# Patient Record
Sex: Male | Born: 1945
Health system: Southern US, Community
[De-identification: ages and names within clinical notes are randomized; demographics above are authoritative.]

## PROBLEM LIST (undated history)

## (undated) DIAGNOSIS — I10 Essential (primary) hypertension: Secondary | ICD-10-CM

## (undated) DIAGNOSIS — M1990 Unspecified osteoarthritis, unspecified site: Secondary | ICD-10-CM

## (undated) HISTORY — PX: OTHER SURGICAL HISTORY: SHX169

## (undated) HISTORY — PX: JOINT REPLACEMENT: SHX530

---

## 2008-10-31 ENCOUNTER — Emergency Department (HOSPITAL_BASED_OUTPATIENT_CLINIC_OR_DEPARTMENT_OTHER): Admission: EM | Admit: 2008-10-31 | Discharge: 2008-10-31 | Payer: Self-pay | Admitting: Emergency Medicine

## 2008-11-02 ENCOUNTER — Emergency Department (HOSPITAL_BASED_OUTPATIENT_CLINIC_OR_DEPARTMENT_OTHER): Admission: EM | Admit: 2008-11-02 | Discharge: 2008-11-02 | Payer: Self-pay | Admitting: Emergency Medicine

## 2010-01-28 ENCOUNTER — Inpatient Hospital Stay (HOSPITAL_COMMUNITY): Admission: RE | Admit: 2010-01-28 | Discharge: 2010-02-02 | Payer: Self-pay | Admitting: Specialist

## 2010-02-10 ENCOUNTER — Encounter (INDEPENDENT_AMBULATORY_CARE_PROVIDER_SITE_OTHER): Payer: Self-pay | Admitting: Specialist

## 2010-02-10 ENCOUNTER — Ambulatory Visit (HOSPITAL_COMMUNITY): Admission: RE | Admit: 2010-02-10 | Discharge: 2010-02-10 | Payer: Self-pay | Admitting: Specialist

## 2010-02-10 ENCOUNTER — Ambulatory Visit: Payer: Self-pay | Admitting: Surgery

## 2011-03-04 LAB — CBC
HCT: 25.1 % — ABNORMAL LOW (ref 39.0–52.0)
HCT: 25.8 % — ABNORMAL LOW (ref 39.0–52.0)
Hemoglobin: 9.7 g/dL — ABNORMAL LOW (ref 13.0–17.0)
MCHC: 35.8 g/dL (ref 30.0–36.0)
MCV: 92.2 fL (ref 78.0–100.0)
MCV: 93.6 fL (ref 78.0–100.0)
Platelets: 187 10*3/uL (ref 150–400)
Platelets: 206 10*3/uL (ref 150–400)
RBC: 2.97 MIL/uL — ABNORMAL LOW (ref 4.22–5.81)
RDW: 12.9 % (ref 11.5–15.5)
RDW: 13.1 % (ref 11.5–15.5)
RDW: 13.2 % (ref 11.5–15.5)
WBC: 6.1 10*3/uL (ref 4.0–10.5)
WBC: 7.2 10*3/uL (ref 4.0–10.5)
WBC: 7.6 10*3/uL (ref 4.0–10.5)

## 2011-03-04 LAB — COMPREHENSIVE METABOLIC PANEL
ALT: 34 U/L (ref 0–53)
Alkaline Phosphatase: 93 U/L (ref 39–117)
CO2: 28 mEq/L (ref 19–32)
Chloride: 107 mEq/L (ref 96–112)
GFR calc Af Amer: >60 mL/min (ref >60)
GFR calc non Af Amer: >60 mL/min (ref >60)
Glucose, Bld: 70 mg/dL (ref 70–99)
Potassium: 4.1 mEq/L (ref 3.5–5.1)
Total Bilirubin: 0.6 mg/dL (ref 0.3–1.2)

## 2011-03-04 LAB — URINALYSIS, MICROSCOPIC ONLY
Glucose, UA: NEGATIVE mg/dL
Nitrite: NEGATIVE
Protein, ur: NEGATIVE mg/dL
Specific Gravity, Urine: 1.012 (ref 1.005–1.030)
Urobilinogen, UA: 0.2 mg/dL (ref 0.0–1.0)

## 2011-03-04 LAB — BASIC METABOLIC PANEL
Calcium: 7.9 mg/dL — ABNORMAL LOW (ref 8.4–10.5)
Chloride: 101 mEq/L (ref 96–112)
Creatinine, Ser: 1 mg/dL (ref 0.4–1.5)
Creatinine, Ser: 1.04 mg/dL (ref 0.4–1.5)
GFR calc non Af Amer: >60 mL/min (ref >60)
Glucose, Bld: 159 mg/dL — ABNORMAL HIGH (ref 70–99)
Potassium: 4.9 mEq/L (ref 3.5–5.1)
Sodium: 133 mEq/L — ABNORMAL LOW (ref 135–145)
Sodium: 136 mEq/L (ref 135–145)

## 2011-03-04 LAB — PROTIME-INR
INR: 0.94 (ref 0.00–1.49)
INR: 2.62 — ABNORMAL HIGH (ref 0.00–1.49)
INR: 3.62 — ABNORMAL HIGH (ref 0.00–1.49)
Prothrombin Time: 12.5 seconds (ref 11.6–15.2)
Prothrombin Time: 14.7 seconds (ref 11.6–15.2)
Prothrombin Time: 23.5 seconds — ABNORMAL HIGH (ref 11.6–15.2)

## 2011-03-04 LAB — TYPE AND SCREEN: Antibody Screen: NEGATIVE

## 2011-03-04 LAB — GLUCOSE, CAPILLARY

## 2011-03-04 LAB — DIFFERENTIAL
Basophils Absolute: 0 10*3/uL (ref 0.0–0.1)
Basophils Relative: 1 % (ref 0–1)
Eosinophils Relative: 4 % (ref 0–5)
Neutrophils Relative %: 56 % (ref 43–77)

## 2011-03-04 LAB — APTT: aPTT: <20 seconds — ABNORMAL LOW (ref 24–37)

## 2011-03-04 LAB — URINALYSIS, ROUTINE W REFLEX MICROSCOPIC
Glucose, UA: NEGATIVE mg/dL
Hgb urine dipstick: NEGATIVE
Ketones, ur: NEGATIVE mg/dL
Nitrite: NEGATIVE
Protein, ur: NEGATIVE mg/dL
Urobilinogen, UA: 0.2 mg/dL (ref 0.0–1.0)

## 2011-03-04 LAB — URINE CULTURE
Colony Count: NO GROWTH
Culture: NO GROWTH
Special Requests: NEGATIVE

## 2011-05-03 IMAGING — CR DG HIP COMPLETE 2+V*R*
3 series · 3 of 3 positions shown · non-contrast
Comparison: None

CLINICAL DATA: Pain.  Preoperative evaluation.

RIGHT HIP - COMPLETE 2+ VIEW

[t pelvis a.p.]
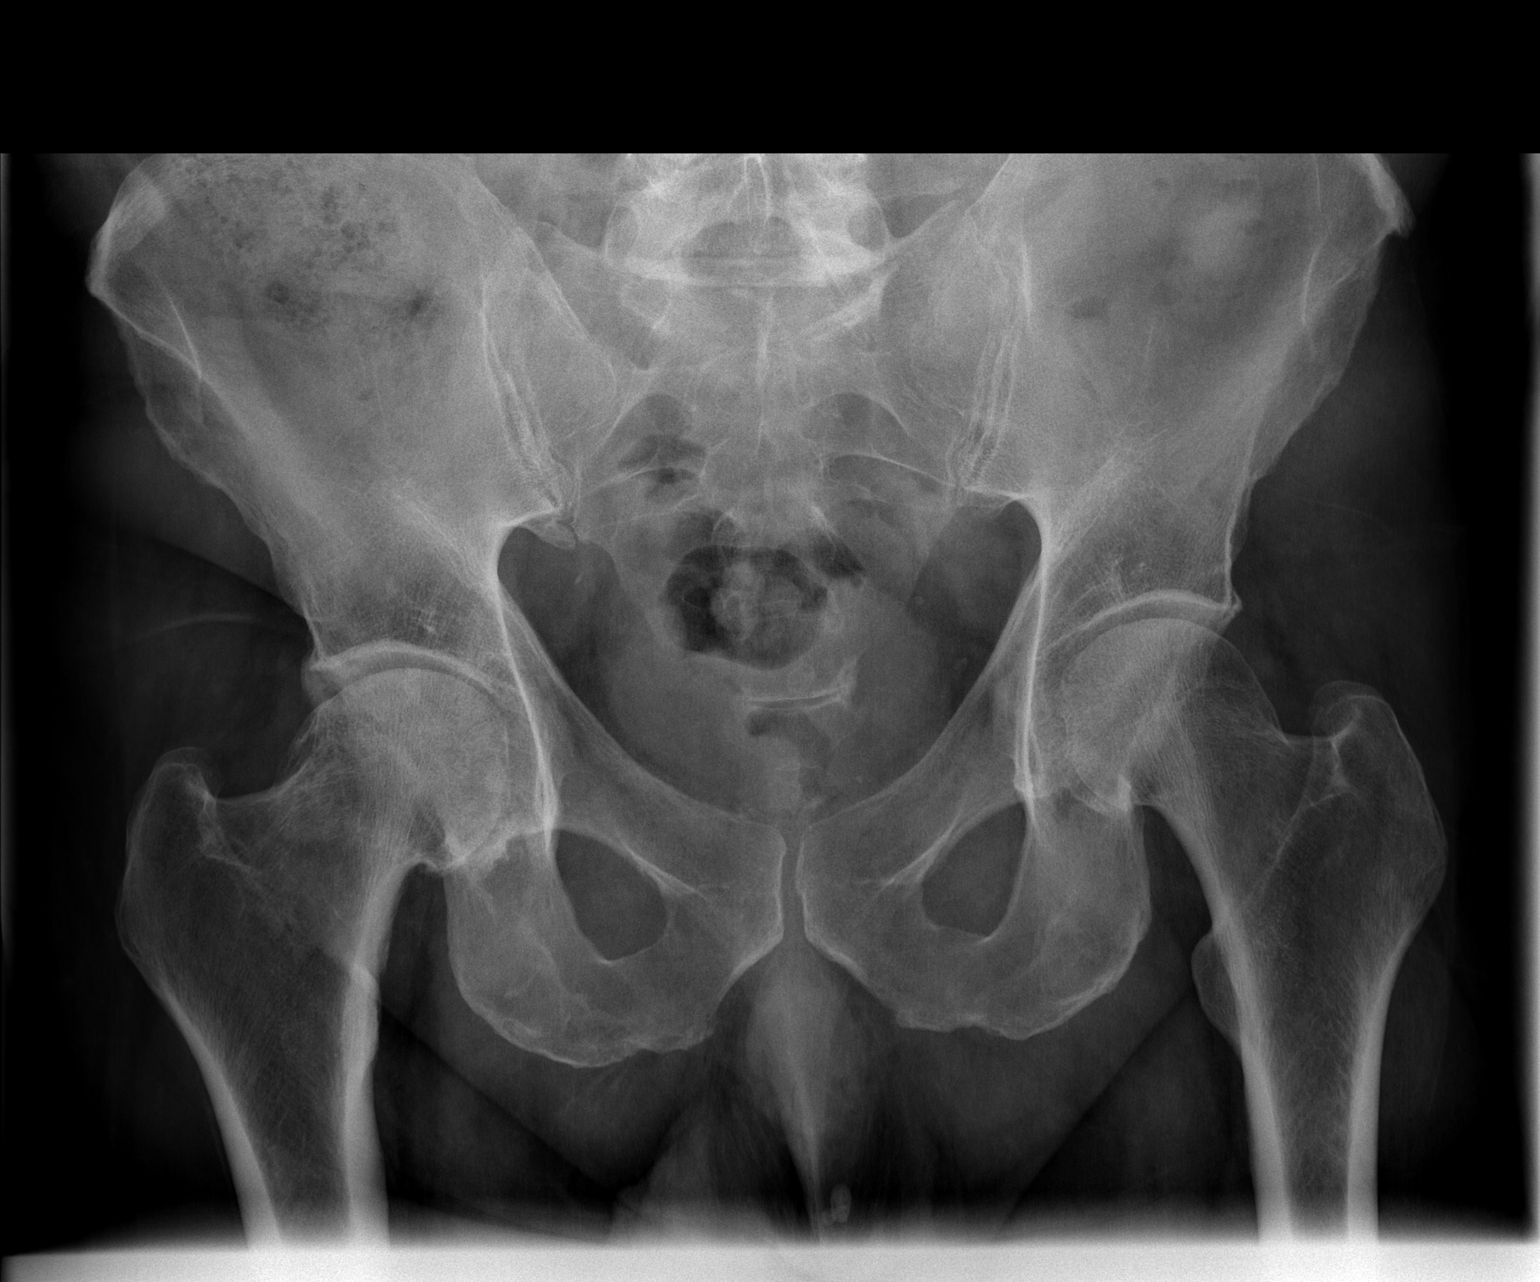

[t hip ap right]
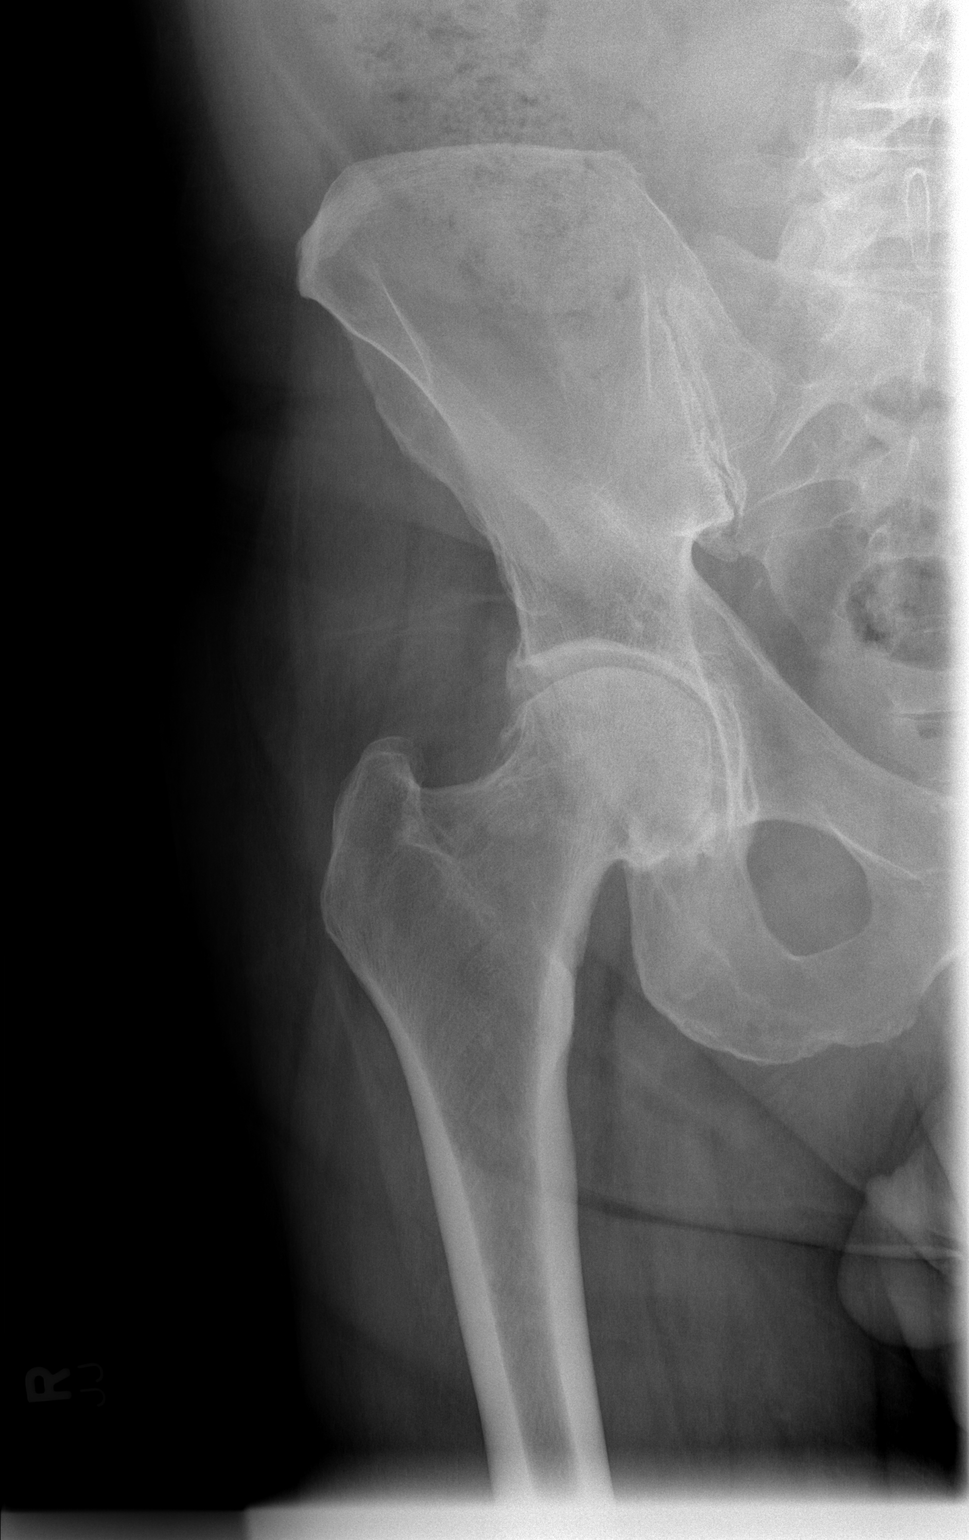

[t hip frog leg right]
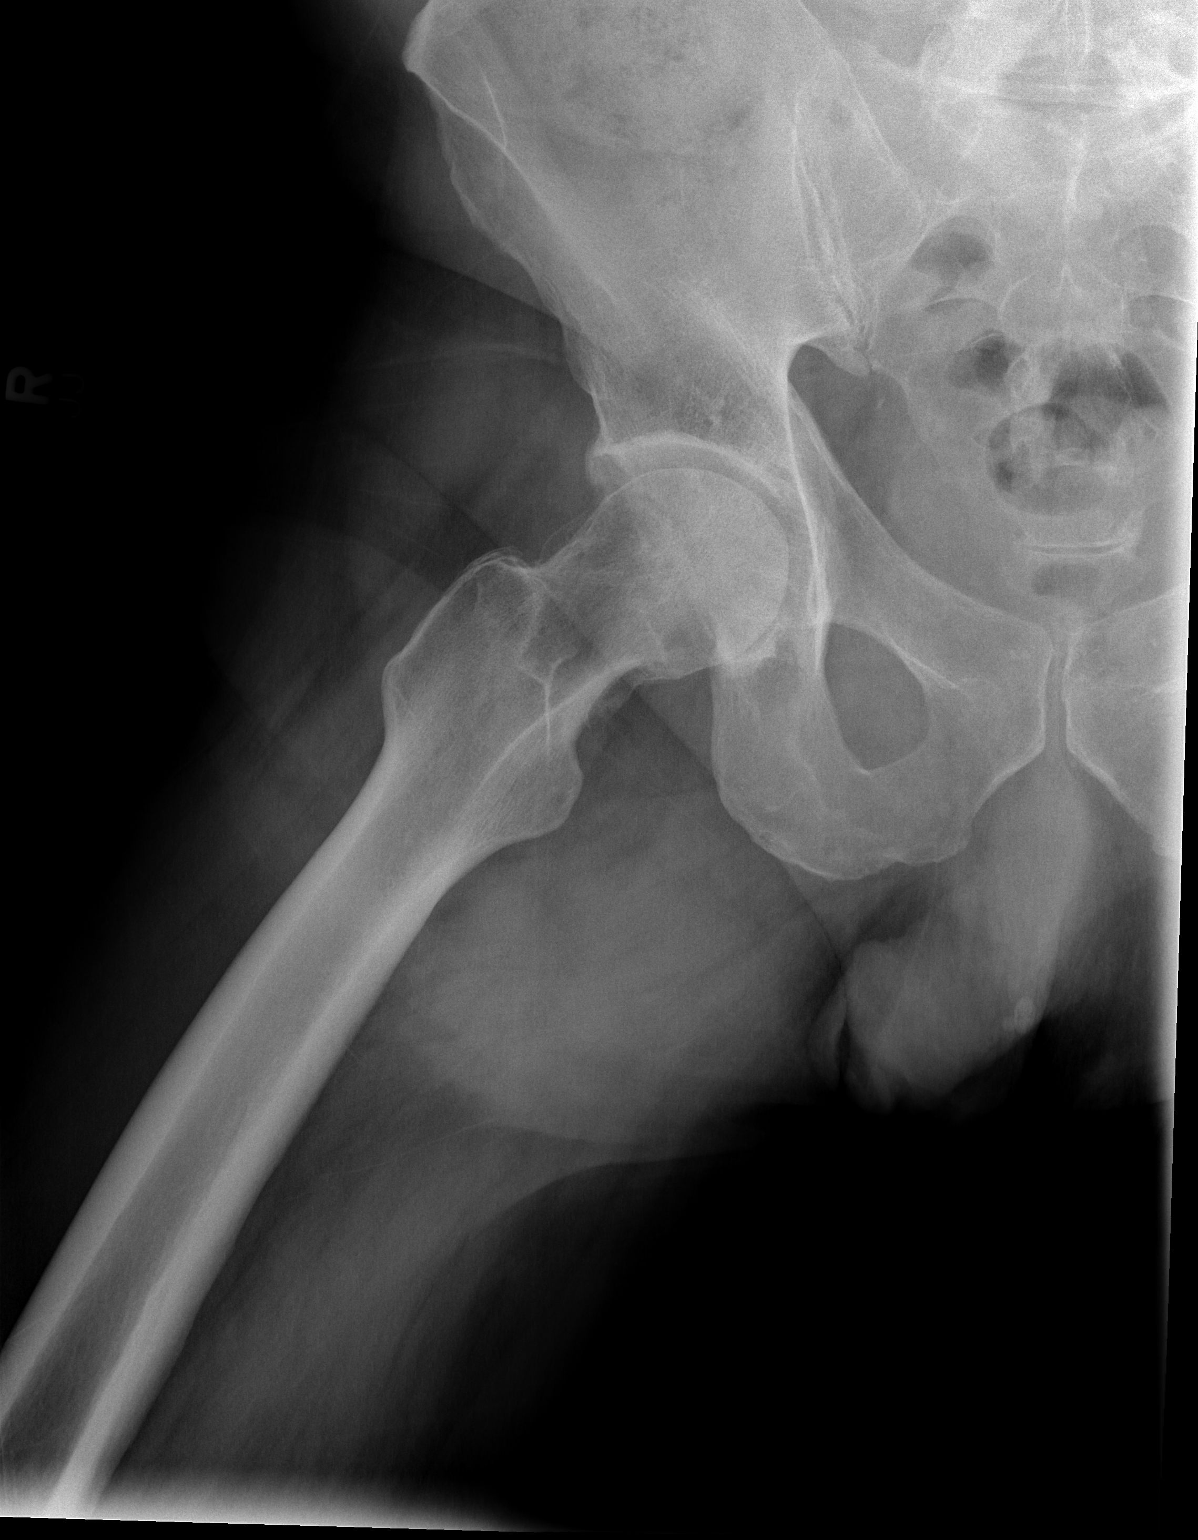

[3 of 3 positions shown; findings below may reference images not displayed]

FINDINGS: There is moderate to marked osteoarthritis of the right
hip.  There is joint space narrowing and there are circumferential
osteophytes.  No evidence of fracture or focal lesion otherwise.
Left hip is unremarkable.  Sacroiliac joints and symphysis pubis
appear unremarkable.
IMPRESSION: Moderate to marked osteoarthritis of the right hip.

## 2011-05-05 IMAGING — CR DG HIP 1V PORT*R*
1 series · 1 of 1 positions shown · non-contrast
Comparison: Preop films of 02/23/2010

CLINICAL DATA: Postop for total hip replacement.

PORTABLE RIGHT HIP - 1 VIEW

[view not recorded]
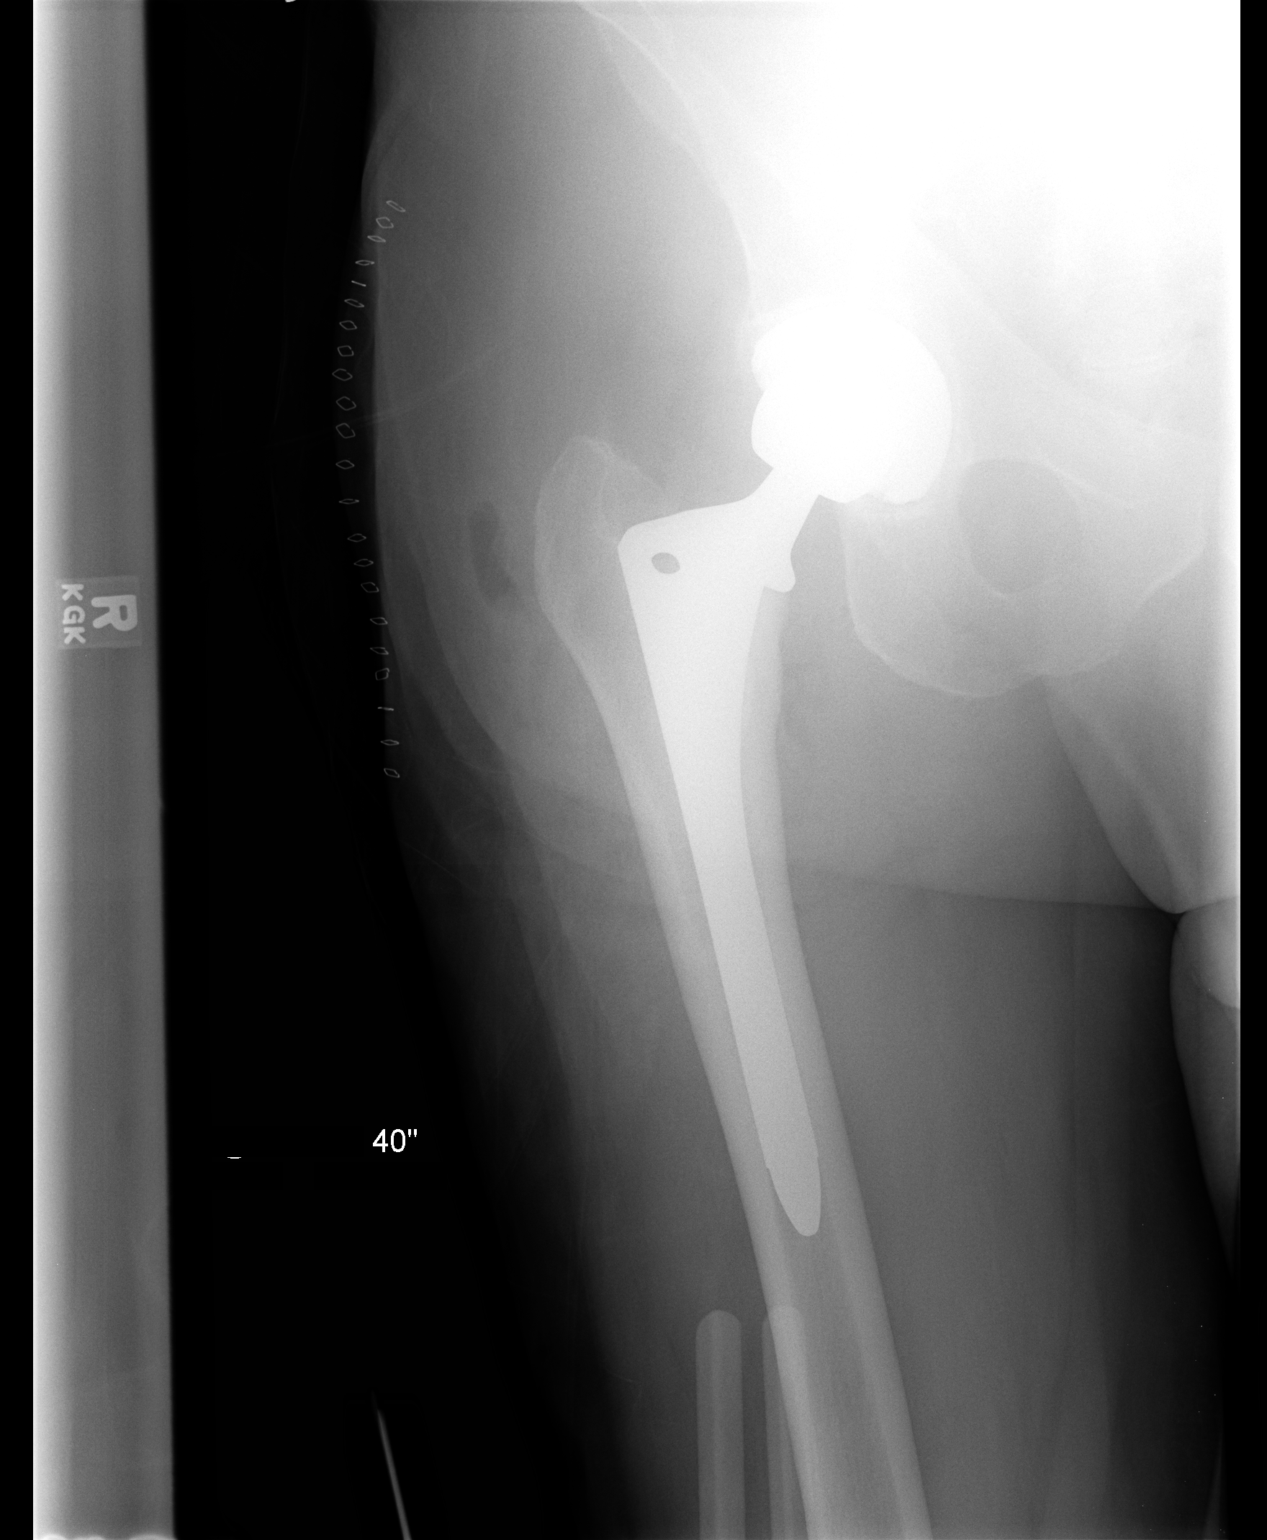

[1 of 1 positions shown; findings below may reference images not displayed]

FINDINGS: Single AP view of the right hip demonstrates interval
arthroplasty.  No acute hardware complication.  No periprosthetic
fracture.
IMPRESSION: Expected appearance after right hip arthroplasty.

## 2012-07-01 ENCOUNTER — Encounter (HOSPITAL_BASED_OUTPATIENT_CLINIC_OR_DEPARTMENT_OTHER): Payer: Self-pay | Admitting: *Deleted

## 2012-07-01 ENCOUNTER — Emergency Department (HOSPITAL_BASED_OUTPATIENT_CLINIC_OR_DEPARTMENT_OTHER)
Admission: EM | Admit: 2012-07-01 | Discharge: 2012-07-01 | Disposition: A | Discharge status: Home / Self Care | Payer: BC Managed Care – PPO | Attending: Emergency Medicine | Admitting: Emergency Medicine

## 2012-07-01 ENCOUNTER — Emergency Department (HOSPITAL_BASED_OUTPATIENT_CLINIC_OR_DEPARTMENT_OTHER): Payer: BC Managed Care – PPO

## 2012-07-01 DIAGNOSIS — R296 Repeated falls: Secondary | ICD-10-CM | POA: Insufficient documentation

## 2012-07-01 DIAGNOSIS — T84029A Dislocation of unspecified internal joint prosthesis, initial encounter: Secondary | ICD-10-CM | POA: Insufficient documentation

## 2012-07-01 DIAGNOSIS — Z96649 Presence of unspecified artificial hip joint: Secondary | ICD-10-CM | POA: Insufficient documentation

## 2012-07-01 DIAGNOSIS — S73004A Unspecified dislocation of right hip, initial encounter: Secondary | ICD-10-CM

## 2012-07-01 DIAGNOSIS — M25569 Pain in unspecified knee: Secondary | ICD-10-CM | POA: Insufficient documentation

## 2012-07-01 MED ORDER — PROPOFOL 10 MG/ML IV BOLUS
INTRAVENOUS | Status: DC | PRN
  Administered 2012-07-01: 100 mg via INTRAVENOUS

## 2012-07-01 MED ORDER — PROPOFOL 10 MG/ML IV BOLUS
INTRAVENOUS | Status: AC | PRN
  Administered 2012-07-01: 45 mg via INTRAVENOUS

## 2012-07-01 MED ORDER — SODIUM CHLORIDE 0.9 % IV BOLUS (SEPSIS)
500.0000 mL | Freq: Once | INTRAVENOUS | Status: AC
  Administered 2012-07-01 (×2): 500 mL via INTRAVENOUS

## 2012-07-01 MED ORDER — PROPOFOL 10 MG/ML IV BOLUS
20.0000 mg | Freq: Once | INTRAVENOUS | Status: AC
  Administered 2012-07-01: 20 mg via INTRAVENOUS
  Filled 2012-07-01: qty 20
  Filled 2012-07-01: qty 2

## 2012-07-01 MED ORDER — SODIUM CHLORIDE 0.9 % IV BOLUS (SEPSIS): 500.0000 mL | Freq: Once | INTRAVENOUS | Status: DC

## 2012-07-01 NOTE — ED Notes (Signed)
MD at bedside to discuss follow up care and discharge instructions

## 2012-07-01 NOTE — ED Provider Notes (Addendum)
History     CSN: [id number]  Arrival date & time 07/01/12  1147   First MD Initiated Contact with Patient 07/01/12 1148      Chief Complaint  Patient presents with  . Hip Pain    (Consider location/radiation/quality/duration/timing/severity/associated sxs/prior treatment) Patient is a 66 y.o. male presenting with hip pain. The history is provided by the patient.  Hip Pain This is a new (Washing his car and stepped wrong causing his right knee twisted and he fell to the ground. Since that time his right hip has been in severe pain) problem. The current episode started less than 1 hour ago. The problem occurs constantly. The problem has not changed since onset.Pertinent negatives include no abdominal pain. Associated symptoms comments: No head injury or LOC denies any pain in other extremities. The symptoms are aggravated by standing, bending and twisting. Nothing relieves the symptoms. He has tried nothing for the symptoms. The treatment provided no relief.    History reviewed. No pertinent past medical history.  Past Surgical History  Procedure Date  . Joint replacement     History reviewed. No pertinent family history.  History  Substance Use Topics  . Smoking status: Never Smoker   . Smokeless tobacco: Not on file  . Alcohol Use:       Review of Systems  Gastrointestinal: Negative for abdominal pain.  Neurological: Negative for weakness and numbness.  All other systems reviewed and are negative.    Allergies  Review of patient's allergies indicates no known allergies.  Home Medications  No current outpatient prescriptions on file.  BP 117/65  Pulse 60  Temp 97.8 F (36.6 C) (Oral)  Resp 16  SpO2 99%  Physical Exam  Nursing note and vitals reviewed. Constitutional: He is oriented to person, place, and time. He appears well-developed and well-nourished. No distress.  HENT:  Head: Normocephalic and atraumatic.  Mouth/Throat: Oropharynx is clear and moist.   Eyes: Conjunctivae and EOM are normal. Pupils are equal, round, and reactive to light.  Neck: Normal range of motion. Neck supple.  Cardiovascular: Normal rate, regular rhythm and intact distal pulses.   No murmur heard. Pulmonary/Chest: Effort normal and breath sounds normal. No respiratory distress. He has no wheezes. He has no rales.  Musculoskeletal: He exhibits no edema and no tenderness.       Right hip: He exhibits decreased range of motion, tenderness and deformity.       Legs: Neurological: He is alert and oriented to person, place, and time.  Skin: Skin is warm and dry. No rash noted. No erythema.  Psychiatric: He has a normal mood and affect. His behavior is normal.    ED Course  Reduction of dislocation Date/Time: 07/01/2012 1:07 PM Performed by: Gwyneth Sprout Authorized by: Gwyneth Sprout Consent: Verbal consent obtained. Written consent obtained. Consent given by: patient Patient understanding: patient states understanding of the procedure being performed Relevant documents: relevant documents present and verified Imaging studies: imaging studies available Patient identity confirmed: verbally with patient Preparation: Patient was prepped and draped in the usual sterile fashion. Local anesthesia used: no Patient sedated: yes Sedatives: propofol Analgesia: fentanyl Vitals: Vital signs were monitored during sedation. Patient tolerance: Patient tolerated the procedure well with no immediate complications. Comments: Dislocation of the hip reduced   (including critical care time)  Labs Reviewed - No data to display Dg Hip Complete Right  07/01/2012  *RADIOLOGY REPORT*  Clinical Data: Injury, pain, right hip replacement  RIGHT HIP - COMPLETE 2+ VIEW  Comparison: 01/28/2010  Findings: Right hip femoral prosthetic component is dislocated superiorly in relation to the acetabular hardware.  Pelvis and left hip appear intact.  No associated fracture.  IMPRESSION:  Dislocated right hip replacement.  Original Report Authenticated By: Judie Petit. Ruel Favors, M.D.   Dg Hip Portable 1 View Right  07/01/2012  *RADIOLOGY REPORT*  Clinical Data: Post reduction of right hip  PORTABLE RIGHT HIP - 1 VIEW  Comparison: 07/01/2012 at 1228 hours  Findings: Interval relocation of right hip arthroplasty.  No fracture or dislocation is seen.  IMPRESSION: Interval relocation of right hip arthroplasty.  Original Report Authenticated By: Charline Bills, M.D.     1. Hip dislocation, right       MDM   Patient with a mechanical fall today where his right knee twisted and his hip has been performed since. He has a history of a hip replacement in concern for dislocation. He is neurovascularly intact. Plain films are pending. No other injury during the fall.   1:06 PM Hip relocated and repeat film wnl.  Pt d/ced home with knee immobilizer.  To f/u with Dr. Elyn Peers, MD 07/01/12 1339  Gwyneth Sprout, MD 07/01/12 1345

## 2012-07-01 NOTE — ED Notes (Signed)
Pt presents to ED today via [facility] for right hip pain after falling from standing while washing the car.  VSS

## 2012-07-01 NOTE — ED Notes (Signed)
Radiology at bedside for post reduction

## 2012-07-01 NOTE — ED Notes (Signed)
Family at bedside. 

## 2012-07-01 NOTE — ED Notes (Signed)
Per EMS, pt has hip replacement 2.77yrs ago to affected side.

## 2012-10-16 ENCOUNTER — Emergency Department (HOSPITAL_COMMUNITY)
Admission: EM | Admit: 2012-10-16 | Discharge: 2012-10-16 | Disposition: A | Discharge status: Home / Self Care | Payer: Medicare Other | Attending: Emergency Medicine | Admitting: Emergency Medicine

## 2012-10-16 ENCOUNTER — Emergency Department (HOSPITAL_COMMUNITY): Payer: Medicare Other

## 2012-10-16 ENCOUNTER — Encounter (HOSPITAL_COMMUNITY): Payer: Self-pay | Admitting: *Deleted

## 2012-10-16 DIAGNOSIS — S73006A Unspecified dislocation of unspecified hip, initial encounter: Secondary | ICD-10-CM | POA: Insufficient documentation

## 2012-10-16 DIAGNOSIS — R296 Repeated falls: Secondary | ICD-10-CM | POA: Insufficient documentation

## 2012-10-16 DIAGNOSIS — Y9301 Activity, walking, marching and hiking: Secondary | ICD-10-CM | POA: Insufficient documentation

## 2012-10-16 DIAGNOSIS — S73004A Unspecified dislocation of right hip, initial encounter: Secondary | ICD-10-CM

## 2012-10-16 DIAGNOSIS — Y929 Unspecified place or not applicable: Secondary | ICD-10-CM | POA: Insufficient documentation

## 2012-10-16 DIAGNOSIS — Z7982 Long term (current) use of aspirin: Secondary | ICD-10-CM | POA: Insufficient documentation

## 2012-10-16 MED ORDER — SODIUM CHLORIDE 0.9 % IV BOLUS (SEPSIS)
500.0000 mL | Freq: Once | INTRAVENOUS | Status: AC
  Administered 2012-10-16: 500 mL via INTRAVENOUS

## 2012-10-16 MED ORDER — PROPOFOL 10 MG/ML IV BOLUS
0.5000 mg/kg | Freq: Once | INTRAVENOUS | Status: AC
  Administered 2012-10-16: 45.6 mg via INTRAVENOUS
  Filled 2012-10-16: qty 1

## 2012-10-16 MED ORDER — FENTANYL CITRATE 0.05 MG/ML IJ SOLN
50.0000 ug | Freq: Once | INTRAMUSCULAR | Status: AC
  Administered 2012-10-16: 50 ug via INTRAVENOUS
  Filled 2012-10-16: qty 2

## 2012-10-16 NOTE — ED Notes (Signed)
45 mg propofol given

## 2012-10-16 NOTE — ED Provider Notes (Addendum)
History     CSN: [id number]  Arrival date & time 10/16/12  1811   First MD Initiated Contact with Patient 10/16/12 1825      Chief Complaint  Patient presents with  . Hip Injury    (Consider location/radiation/quality/duration/timing/severity/associated sxs/prior treatment) Patient is a 66 y.o. male presenting with hip pain. The history is provided by the patient.  Hip Pain This is a new problem. The current episode started 1 to 2 hours ago. The problem occurs constantly. The problem has not changed since onset.Associated symptoms comments: Was walking and his ankle rolled causing his leg to twist inward and he dislocated his right hip. The symptoms are aggravated by bending and twisting. Nothing relieves the symptoms. He has tried nothing for the symptoms. The treatment provided no relief.    History reviewed. No pertinent past medical history.  Past Surgical History  Procedure Date  . Joint replacement     No family history on file.  History  Substance Use Topics  . Smoking status: Never Smoker   . Smokeless tobacco: Not on file  . Alcohol Use: No      Review of Systems  All other systems reviewed and are negative.    Allergies  Review of patient's allergies indicates no known allergies.  Home Medications   Current Outpatient Rx  Name  Route  Sig  Dispense  Refill  . ASPIRIN EC 81 MG PO TBEC   Oral   Take 81 mg by mouth daily.           BP 111/51  Pulse 72  Temp 97.7 F (36.5 C) (Oral)  Resp 16  SpO2 95%  Physical Exam  Nursing note and vitals reviewed. Constitutional: He is oriented to person, place, and time. He appears well-developed and well-nourished. No distress.  HENT:  Head: Normocephalic and atraumatic.  Mouth/Throat: Oropharynx is clear and moist.  Eyes: Conjunctivae normal and EOM are normal. Pupils are equal, round, and reactive to light.  Neck: Normal range of motion. Neck supple.  Cardiovascular: Normal rate, regular rhythm and  intact distal pulses.   No murmur heard. Pulmonary/Chest: Effort normal and breath sounds normal. No respiratory distress. He has no wheezes. He has no rales.  Abdominal: Soft. He exhibits no distension. There is no tenderness. There is no rebound and no guarding.  Musculoskeletal: He exhibits no edema and no tenderness.       Right hip: He exhibits decreased range of motion, tenderness, bony tenderness and deformity.       Right hip is internally rotated and shortened.  Normal 2+ DP, PT pulses.  Neurological: He is alert and oriented to person, place, and time.  Skin: Skin is warm and dry. No rash noted. No erythema.  Psychiatric: He has a normal mood and affect. His behavior is normal.    ED Course  Reduction of dislocation Date/Time: 10/16/2012 8:14 PM Performed by: Gwyneth Sprout Authorized by: Gwyneth Sprout Consent: Verbal consent obtained. Written consent obtained. Risks and benefits: risks, benefits and alternatives were discussed Consent given by: patient Patient understanding: patient states understanding of the procedure being performed Patient consent: the patient's understanding of the procedure matches consent given Procedure consent: procedure consent matches procedure scheduled Relevant documents: relevant documents present and verified Site marked: the operative site was marked Imaging studies: imaging studies available Patient identity confirmed: verbally with patient, arm band, provided demographic data and hospital-assigned identification number Time out: Immediately prior to procedure a "time out" was called to verify  the correct patient, procedure, equipment, support staff and site/side marked as required. Preparation: Patient was prepped and draped in the usual sterile fashion. Local anesthesia used: no Patient sedated: yes Patient tolerance: Patient tolerated the procedure well with no immediate complications. Comments: Right dislocated hip was reduced  with hip flexion and pelvic pressure.  Successful relocation   (including critical care time)  Labs Reviewed - No data to display Dg Hip 1 View Right  10/16/2012  *RADIOLOGY REPORT*  Clinical Data: Closed reduction of right hip dislocation.  RIGHT HIP - 1 VIEW  Comparison: 10/16/2012 at 6:47 p.m.  Findings: On this single view, the femoral head portion of the total hip prosthesis projects in the expected location over the acetabular shell component.  IMPRESSION:  1.  Positioning of the femoral head component of the hip prosthesis over the acetabular shell component on this single view strongly favors successful reduction.   Original Report Authenticated By: Gaylyn Rong, M.D.    Dg Hip Complete Right  10/16/2012  *RADIOLOGY REPORT*  Clinical Data: Right hip pain.  RIGHT HIP - COMPLETE 2+ VIEW  Comparison: Right hip x-rays 07/01/2012.  Findings: Superior dislocation of the right hip prosthesis.  No visible fractures.  IMPRESSION: Superior dislocation of the right hip prosthesis.   Original Report Authenticated By: Hulan Saas, M.D.    Procedural sedation Performed by: Gwyneth Sprout Consent: Verbal consent obtained. Risks and benefits: risks, benefits and alternatives were discussed Required items: required blood products, implants, devices, and special equipment available Patient identity confirmed: arm band and provided demographic data Time out: Immediately prior to procedure a "time out" was called to verify the correct patient, procedure, equipment, support staff and site/side marked as required.  Sedation type: moderate (conscious) sedation NPO time confirmed and considedered  Sedatives: PROPOFOL  Physician Time at Bedside: 1945-2000  Vitals: Vital signs were monitored during sedation. Cardiac Monitor, pulse oximeter Patient tolerance: Patient tolerated the procedure well with no immediate complications. Comments: Pt with uneventful recovered. Returned to pre-procedural  sedation baseline     1. Hip dislocation, right       MDM   Pt with a mechanical fall and right hip dislocated.  This occurred once before.  Denies any foot or ankle pain.  Plain flms with uncomplicated right hip dislocation.  Will attempt reduction.  8:13 PM Pt consciously sedated and hip relocated.  Repeat images show relocation of hip.  KI placed. Pt able to ambulate wtihout difficulty  Discussed with Dr. Rennis Chris and pt will be d/dced home to talk with Dr. Shelle Iron in the morning.    Gwyneth Sprout, MD 10/16/12 1610  Gwyneth Sprout, MD 10/16/12 [id number]  Gwyneth Sprout, MD 10/16/12 5409  Gwyneth Sprout, MD 10/16/12 2051  Gwyneth Sprout, MD 10/16/12 2051

## 2012-10-16 NOTE — ED Notes (Signed)
Per ems: pt from home, "twisted foot" while ambulating, foot rolled outward and right hip dislocated. Pt dislocated right hip in July, has had hip replacement. 250 mcg fentanyl given en route, pain under control. 20 gauge in left wrist, NS hanging. bp 132/62, pulse 80, respirations 16 even/unlabored

## 2012-10-16 NOTE — ED Notes (Signed)
Hip reduced

## 2012-10-16 NOTE — ED Notes (Signed)
EXB:MW41<LK> Expected date:10/16/12<BR> Expected time: 5:53 PM<BR> Means of arrival:Ambulance<BR> Comments:<BR> ?hip dislocation/elderly

## 2012-10-19 ENCOUNTER — Emergency Department (HOSPITAL_COMMUNITY)
Admission: EM | Admit: 2012-10-19 | Discharge: 2012-10-19 | Disposition: A | Discharge status: Home / Self Care | Payer: Medicare Other | Attending: Emergency Medicine | Admitting: Emergency Medicine

## 2012-10-19 ENCOUNTER — Emergency Department (HOSPITAL_COMMUNITY): Payer: Medicare Other

## 2012-10-19 ENCOUNTER — Encounter (HOSPITAL_COMMUNITY): Payer: Self-pay | Admitting: Emergency Medicine

## 2012-10-19 DIAGNOSIS — Y999 Unspecified external cause status: Secondary | ICD-10-CM | POA: Insufficient documentation

## 2012-10-19 DIAGNOSIS — Y33XXXA Other specified events, undetermined intent, initial encounter: Secondary | ICD-10-CM | POA: Insufficient documentation

## 2012-10-19 DIAGNOSIS — Z96649 Presence of unspecified artificial hip joint: Secondary | ICD-10-CM | POA: Insufficient documentation

## 2012-10-19 DIAGNOSIS — Y929 Unspecified place or not applicable: Secondary | ICD-10-CM | POA: Insufficient documentation

## 2012-10-19 DIAGNOSIS — Z7982 Long term (current) use of aspirin: Secondary | ICD-10-CM | POA: Insufficient documentation

## 2012-10-19 DIAGNOSIS — T84029A Dislocation of unspecified internal joint prosthesis, initial encounter: Secondary | ICD-10-CM

## 2012-10-19 DIAGNOSIS — M24459 Recurrent dislocation, unspecified hip: Secondary | ICD-10-CM | POA: Insufficient documentation

## 2012-10-19 DIAGNOSIS — T8489XA Other specified complication of internal orthopedic prosthetic devices, implants and grafts, initial encounter: Secondary | ICD-10-CM | POA: Insufficient documentation

## 2012-10-19 MED ORDER — PROPOFOL 10 MG/ML IV BOLUS
0.5000 mg/kg | Freq: Once | INTRAVENOUS | Status: AC
  Administered 2012-10-19: 45.8 mg via INTRAVENOUS
  Filled 2012-10-19: qty 1

## 2012-10-19 MED ORDER — FENTANYL CITRATE 0.05 MG/ML IJ SOLN
100.0000 ug | Freq: Once | INTRAMUSCULAR | Status: AC
  Administered 2012-10-19: 100 ug via INTRAVENOUS
  Filled 2012-10-19: qty 2

## 2012-10-19 MED ORDER — PROPOFOL 10 MG/ML IV BOLUS
0.5000 mg/kg | Freq: Once | INTRAVENOUS | Status: AC
  Administered 2012-10-19: 45.8 mg via INTRAVENOUS

## 2012-10-19 NOTE — ED Notes (Signed)
Pt transported via EMS from home for R hip pain, possible dislocation while getting dressed this am @ 6:00. Pt seen her this week for same.

## 2012-10-19 NOTE — ED Notes (Signed)
Pt has obvious deformity to R hip.  Pt sts his hip "popped out" on Monday and had to be put back in.  Hx of hip replacement in 2011.

## 2012-10-19 NOTE — ED Notes (Signed)
ZOX:[id number]<AV> Expected date:10/19/12<BR> Expected time: 6:46 AM<BR> Means of arrival:Ambulance<BR> Comments:<BR> Hip pain

## 2012-10-19 NOTE — ED Provider Notes (Signed)
History     CSN: [id number]  Arrival date & time 10/19/12  0708   First MD Initiated Contact with Patient 10/19/12 [phone]      Chief Complaint  Patient presents with  . Hip Pain    (Consider location/radiation/quality/duration/timing/severity/associated sxs/prior treatment) Patient is a 65 y.o. male presenting with hip pain. The history is provided by the patient.  Hip Pain This is a recurrent problem. Pertinent negatives include no abdominal pain.  patient states that he dislocated his right hip prosthesis again. He states that he did it on Monday and got back in June. He states that Dr. Shelle Iron told him that if it happened again he may need to do something different. No trauma. He states he was trying to put on his underwear.  History reviewed. No pertinent past medical history.  Past Surgical History  Procedure Date  . Joint replacement     No family history on file.  History  Substance Use Topics  . Smoking status: Never Smoker   . Smokeless tobacco: Not on file  . Alcohol Use: Yes     Comment: occasional      Review of Systems  Constitutional: Negative for fever.  Cardiovascular: Negative for leg swelling.  Gastrointestinal: Negative for abdominal pain.  Genitourinary: Negative for flank pain.  Musculoskeletal:       Right hip pain with difficulty moving lower extremity  Neurological: Negative for weakness and numbness.    Allergies  Review of patient's allergies indicates no known allergies.  Home Medications   Current Outpatient Rx  Name  Route  Sig  Dispense  Refill  . ASPIRIN EC 81 MG PO TBEC   Oral   Take 81 mg by mouth daily.           BP 119/56  Pulse 69  Temp 97.8 F (36.6 C) (Oral)  Resp 13  Ht 6\' 1"  (1.854 m)  Wt 202 lb (91.627 kg)  BMI 26.65 kg/m2  SpO2 98%  Physical Exam  Constitutional: He appears well-developed.  HENT:  Head: Normocephalic.  Eyes: Pupils are equal, round, and reactive to light.  Cardiovascular: Normal rate.    Pulmonary/Chest: Effort normal and breath sounds normal. He exhibits no tenderness.  Abdominal: There is no tenderness.  Musculoskeletal: He exhibits tenderness.       Right lower extremity rotated medially. Tenderness over hip. Neurovascular intact distally.  Neurological: He is alert.    ED Course  ORTHOPEDIC INJURY TREATMENT Performed by: Benjiman Core R. Authorized by: Billee Cashing Consent: Verbal consent obtained. Written consent obtained. Risks and benefits: risks, benefits and alternatives were discussed Consent given by: patient Patient understanding: patient states understanding of the procedure being performed Patient consent: the patient's understanding of the procedure matches consent given Procedure consent: procedure consent matches procedure scheduled Relevant documents: relevant documents present and verified Imaging studies: imaging studies available Required items: required blood products, implants, devices, and special equipment available Patient identity confirmed: verbally with patient and arm band Time out: Immediately prior to procedure a "time out" was called to verify the correct patient, procedure, equipment, support staff and site/side marked as required. Injury location: hip Location details: right hip Injury type: dislocation Dislocation type: posterior Spontaneous dislocation: yes Prosthesis: yes Pre-procedure neurovascular assessment: neurovascularly intact Pre-procedure distal perfusion: normal Pre-procedure neurological function: normal Pre-procedure range of motion: reduced Local anesthesia used: no Patient sedated: yes Sedation type: moderate (conscious) sedation Sedatives: propofol Analgesia: fentanyl Vitals: Vital signs were monitored during sedation. (10 minutes of  sedation time) Manipulation performed: yes Reduction method: traction and counter traction Reduction successful: yes X-ray confirmed reduction:  yes Immobilization: brace Supplies used: Patient used his own brace from home. Post-procedure neurovascular assessment: post-procedure neurovascularly intact Post-procedure distal perfusion: normal Post-procedure neurological function: normal Post-procedure range of motion: improved Patient tolerance: Patient tolerated the procedure well with no immediate complications.   (including critical care time)  Labs Reviewed - No data to display Dg Hip Complete Right  10/19/2012  *RADIOLOGY REPORT*  Clinical Data: Right hip pain/dislocation  RIGHT HIP - COMPLETE 2+ VIEW  Comparison: 10/16/2012  Findings: Right total hip arthroplasty with posterior/superior dislocation of the femoral component.  No periprosthetic lucency. Diffuse osteopenia.  IMPRESSION: Right THA dislocation.   Original Report Authenticated By: Jearld Lesch, M.D.    Dg Hip Portable 1 View Right  10/19/2012  *RADIOLOGY REPORT*  Clinical Data: Postreduction  PORTABLE RIGHT HIP - 1 VIEW  Comparison: Prior films same day  Findings: Two views of the right hip submitted.  Postreduction right hip prosthesis  in anatomic alignment.  IMPRESSION: Postreduction right hip prosthesis in anatomic alignment.   Original Report Authenticated By: Natasha Mead, M.D.      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 Had previous dislocation on Monday. Reduced under sedation with propofol. After discussion with Ortho the patient will followup in the office. Patient states that he does not need pain medications.        Juliet Rude. Rubin Payor, MD 10/19/12 [phone]

## 2012-11-27 ENCOUNTER — Encounter (HOSPITAL_COMMUNITY): Payer: Self-pay | Admitting: Pharmacy Technician

## 2012-11-28 ENCOUNTER — Encounter (HOSPITAL_COMMUNITY)
Admission: RE | Admit: 2012-11-28 | Discharge: 2012-11-28 | Disposition: A | Discharge status: Home / Self Care | Payer: Medicare Other | Source: Ambulatory Visit | Attending: Orthopedic Surgery | Admitting: Orthopedic Surgery

## 2012-11-28 ENCOUNTER — Encounter (HOSPITAL_COMMUNITY): Payer: Self-pay

## 2012-11-28 HISTORY — DX: Unspecified osteoarthritis, unspecified site: M19.90

## 2012-11-28 LAB — BASIC METABOLIC PANEL
Calcium: 9.4 mg/dL (ref 8.4–10.5)
Creatinine, Ser: 1.19 mg/dL (ref 0.50–1.35)
GFR calc Af Amer: 71 mL/min — ABNORMAL LOW (ref >90)
GFR calc non Af Amer: 61 mL/min — ABNORMAL LOW (ref >90)

## 2012-11-28 LAB — SURGICAL PCR SCREEN
MRSA, PCR: NEGATIVE
Staphylococcus aureus: NEGATIVE

## 2012-11-28 LAB — URINALYSIS, ROUTINE W REFLEX MICROSCOPIC
Bilirubin Urine: NEGATIVE
Ketones, ur: NEGATIVE mg/dL
Leukocytes, UA: NEGATIVE
Nitrite: NEGATIVE
Protein, ur: NEGATIVE mg/dL

## 2012-11-28 LAB — CBC
Platelets: 217 10*3/uL (ref 150–400)
RDW: 12.4 % (ref 11.5–15.5)
WBC: 5.4 10*3/uL (ref 4.0–10.5)

## 2012-11-28 LAB — APTT: aPTT: 29 seconds (ref 24–37)

## 2012-11-28 LAB — PROTIME-INR: INR: 0.98 (ref 0.00–1.49)

## 2012-11-28 NOTE — Patient Instructions (Addendum)
YOUR SURGERY IS SCHEDULED AT [facility]  ON:   Thursday  12/26  REPORT TO [facility] AT: 5:15 AM      PHONE # FOR SHORT STAY IS [phone]  DO NOT EAT OR DRINK ANYTHING AFTER MIDNIGHT THE NIGHT BEFORE YOUR SURGERY.  YOU MAY BRUSH YOUR TEETH, RINSE OUT YOUR MOUTH--BUT NO WATER, NO FOOD, NO CHEWING GUM, NO MINTS, NO CANDIES, NO CHEWING TOBACCO.  PLEASE TAKE THE FOLLOWING MEDICATIONS THE AM OF YOUR SURGERY WITH A FEW SIPS OF WATER:  IF YOU USE INHALERS--USE YOUR INHALERS THE AM OF YOUR SURGERY AND BRING INHALERS TO THE HOSPITAL -TAKE TO SURGERY.    IF YOU ARE DIABETIC:  DO NOT TAKE ANY DIABETIC MEDICATIONS THE AM OF YOUR SURGERY.  IF YOU TAKE INSULIN IN THE EVENINGS--PLEASE ONLY TAKE 1/2 NORMAL EVENING DOSE THE NIGHT BEFORE YOUR SURGERY.  NO INSULIN THE AM OF YOUR SURGERY.  IF YOU HAVE SLEEP APNEA AND USE CPAP OR BIPAP--PLEASE BRING THE MASK AND THE TUBING.  DO NOT BRING YOUR MACHINE.  DO NOT BRING VALUABLES, MONEY, CREDIT CARDS.  DO NOT WEAR JEWELRY, MAKE-UP, NAIL POLISH AND NO METAL PINS OR CLIPS IN YOUR HAIR. CONTACT LENS, DENTURES / PARTIALS, GLASSES SHOULD NOT BE WORN TO SURGERY AND IN MOST CASES-HEARING AIDS WILL NEED TO BE REMOVED.  BRING YOUR GLASSES CASE, ANY EQUIPMENT NEEDED FOR YOUR CONTACT LENS. FOR PATIENTS ADMITTED TO THE HOSPITAL--CHECK OUT TIME THE DAY OF DISCHARGE IS 11:00 AM.  ALL INPATIENT ROOMS ARE PRIVATE - WITH BATHROOM, TELEPHONE, TELEVISION AND WIFI INTERNET.  IF YOU ARE BEING DISCHARGED THE SAME DAY OF YOUR SURGERY--YOU CAN NOT DRIVE YOURSELF HOME--AND SHOULD NOT GO HOME ALONE BY TAXI OR BUS.  NO DRIVING OR OPERATING MACHINERY FOR 24 HOURS FOLLOWING ANESTHESIA / PAIN MEDICATIONS.  PLEASE MAKE ARRANGEMENTS FOR SOMEONE TO BE WITH YOU AT HOME THE FIRST 24 HOURS AFTER SURGERY. RESPONSIBLE DRIVER'S NAME___________________________                                               PHONE #   _______________________                               PLEASE  READ OVER ANY  FACT SHEETS THAT YOU WERE GIVEN: MRSA INFORMATION, BLOOD TRANSFUSION INFORMATION, INCENTIVE SPIROMETER INFORMATION. FAILURE TO FOLLOW THESE INSTRUCTIONS MAY RESULT IN THE CANCELLATION OF YOUR SURGERY.   PATIENT SIGNATURE_________________________________

## 2012-11-28 NOTE — Pre-Procedure Instructions (Signed)
CBC, BMET, PT, PTT, UA WERE DONE TODAY - PREOP - AT [facility] - AS PER ORDERS DR. Charlann Boxer.     PT WILL DO T/S DAY OF SURGERY.  PT HAS EKG REPORT AND MEDICAL OK FOR HIP SURGERY FROM DR. CORRINGTON FROM 11/02/12-[facility] ON PT'S CHART.  CXR NOT NEEDED PER ANESTHESIOLOGIST'S GUIDELINES.

## 2012-12-05 NOTE — H&P (Signed)
Terry Savage is an 66 y.o. male.    Chief Complaint:    Right hip pain with multiple dislocations s/p total hip arthroplasty  HPI: Pt is a 66 y.o. male complaining of right hip pain for a year. Pain had continually increased since the beginning his first dislocation in July of 2013.  Since that time he has had 2 more dislocations.  X-rays in the clinic show previous total hip arthroplasty of the right hip. Pt has tried various conservative treatments which have failed to alleviate their symptoms. Various options are discussed with the patient. Risks, benefits and expectations were discussed with the patient. Patient understand the risks, benefits and expectations and wishes to proceed with surgery.   PCP:  No primary provider on file.  D/C Plans:  Home with HHPT  Post-op Meds:   Rx given for ASA, Robaxin, Iron, Colace and MiraLax  Tranexamic Acid:   To be given  Decadron:   To be given  PMH: Past Medical History  Diagnosis Date  . 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 Joint replacement     01/2010 RT HIP REPLACEMENT  . Surgery for rt hip dislocations x 3      Social History:  reports that he has never smoked. He does not have any smokeless tobacco history on file. He reports that he drinks alcohol. He reports that he does not use illicit drugs.  Allergies:  No Known Allergies  Medications: No current facility-administered medications for this encounter.   Current Outpatient Prescriptions  Medication Sig Dispense Refill  . aspirin EC 81 MG tablet Take 81 mg by mouth daily.         ROS: Review of Systems  Constitutional: Negative.   HENT: Negative.   Eyes: Negative.   Respiratory: Negative.   Cardiovascular: Negative.   Gastrointestinal: Negative.   Genitourinary: Negative.   Musculoskeletal: Positive for joint pain.  Skin: Negative.   Neurological: Negative.     Endo/Heme/Allergies: Negative.   Psychiatric/Behavioral: Negative.      Physical Exam: Physical Exam  Constitutional: He is oriented to person, place, and time and well-developed, well-nourished, and in no distress.  HENT:  Head: Normocephalic and atraumatic.  Mouth/Throat: Oropharynx is clear and moist.  Eyes: Pupils are equal, round, and reactive to light.  Neck: Neck supple. No JVD present. No tracheal deviation present. No thyromegaly present.  Cardiovascular: Normal rate, regular rhythm, normal heart sounds and intact distal pulses.   Pulmonary/Chest: Effort normal and breath sounds normal. No stridor. No respiratory distress. He has no wheezes.  Abdominal: Soft. There is no tenderness. There is no guarding.  Musculoskeletal:       Right hip: He exhibits decreased range of motion, decreased strength, tenderness and bony tenderness. He exhibits no swelling, no deformity and no laceration.  Lymphadenopathy:    He has no cervical adenopathy.  Neurological: He is alert and oriented to person, place, and time.  Skin: Skin is warm and dry.  Psychiatric: Affect normal.     Assessment/Plan Assessment:   Right hip pain with multiple dislocations s/p total hip arthroplasty  Plan: Patient will undergo a right hip revision on 12/07/2012 per Dr. Charlann Boxer at [facility]. Risks benefits and expectations were discussed with the patient. Patient understand risks, benefits and expectations and wishes to proceed.   Anastasio Auerbach Shea Kapur   PAC  12/05/2012, 5:47 PM

## 2012-12-07 ENCOUNTER — Inpatient Hospital Stay (HOSPITAL_COMMUNITY): Payer: Medicare Other

## 2012-12-07 ENCOUNTER — Encounter (HOSPITAL_COMMUNITY): Payer: Self-pay | Admitting: Certified Registered Nurse Anesthetist

## 2012-12-07 ENCOUNTER — Encounter (HOSPITAL_COMMUNITY): Payer: Self-pay | Admitting: Surgery

## 2012-12-07 ENCOUNTER — Encounter (HOSPITAL_COMMUNITY)
Admission: RE | Disposition: A | Discharge status: Home Health | Payer: Self-pay | Source: Ambulatory Visit | Attending: Orthopedic Surgery

## 2012-12-07 ENCOUNTER — Inpatient Hospital Stay (HOSPITAL_COMMUNITY): Payer: Medicare Other | Admitting: Certified Registered Nurse Anesthetist

## 2012-12-07 ENCOUNTER — Encounter (HOSPITAL_COMMUNITY): Payer: Self-pay

## 2012-12-07 ENCOUNTER — Inpatient Hospital Stay (HOSPITAL_COMMUNITY)
Admission: RE | Admit: 2012-12-07 | Discharge: 2012-12-08 | DRG: 467 | Disposition: A | Discharge status: Home Health | Payer: Medicare Other | Source: Ambulatory Visit | Attending: Orthopedic Surgery | Admitting: Orthopedic Surgery

## 2012-12-07 DIAGNOSIS — D62 Acute posthemorrhagic anemia: Secondary | ICD-10-CM | POA: Diagnosis not present

## 2012-12-07 DIAGNOSIS — Z6829 Body mass index (BMI) 29.0-29.9, adult: Secondary | ICD-10-CM

## 2012-12-07 DIAGNOSIS — E663 Overweight: Secondary | ICD-10-CM | POA: Diagnosis present

## 2012-12-07 DIAGNOSIS — Y831 Surgical operation with implant of artificial internal device as the cause of abnormal reaction of the patient, or of later complication, without mention of misadventure at the time of the procedure: Secondary | ICD-10-CM | POA: Diagnosis present

## 2012-12-07 DIAGNOSIS — T84099A Other mechanical complication of unspecified internal joint prosthesis, initial encounter: Principal | ICD-10-CM | POA: Diagnosis present

## 2012-12-07 DIAGNOSIS — T84029A Dislocation of unspecified internal joint prosthesis, initial encounter: Secondary | ICD-10-CM | POA: Diagnosis present

## 2012-12-07 DIAGNOSIS — Z96649 Presence of unspecified artificial hip joint: Secondary | ICD-10-CM

## 2012-12-07 DIAGNOSIS — D500 Iron deficiency anemia secondary to blood loss (chronic): Secondary | ICD-10-CM

## 2012-12-07 HISTORY — PX: TOTAL HIP REVISION: SHX763

## 2012-12-07 LAB — TYPE AND SCREEN
ABO/RH(D): O POS
Antibody Screen: NEGATIVE

## 2012-12-07 SURGERY — TOTAL HIP REVISION
Anesthesia: General | Site: Hip | Laterality: Right | Wound class: Clean

## 2012-12-07 MED ORDER — METOCLOPRAMIDE HCL 10 MG PO TABS: 5.0000 mg | ORAL_TABLET | Freq: Three times a day (TID) | ORAL | Status: DC | PRN

## 2012-12-07 MED ORDER — FENTANYL CITRATE 0.05 MG/ML IJ SOLN
INTRAMUSCULAR | Status: DC | PRN
  Administered 2012-12-07 (×2): 25 ug via INTRAVENOUS
  Administered 2012-12-07 (×4): 50 ug via INTRAVENOUS

## 2012-12-07 MED ORDER — CEFAZOLIN SODIUM-DEXTROSE 2-3 GM-% IV SOLR
2.0000 g | INTRAVENOUS | Status: AC
  Administered 2012-12-07: 2 g via INTRAVENOUS

## 2012-12-07 MED ORDER — METHOCARBAMOL 100 MG/ML IJ SOLN
500.0000 mg | Freq: Four times a day (QID) | INTRAVENOUS | Status: DC | PRN
  Administered 2012-12-07: 500 mg via INTRAVENOUS
  Filled 2012-12-07: qty 5

## 2012-12-07 MED ORDER — PROMETHAZINE HCL 25 MG/ML IJ SOLN: 6.2500 mg | INTRAMUSCULAR | Status: DC | PRN

## 2012-12-07 MED ORDER — DEXAMETHASONE SODIUM PHOSPHATE 10 MG/ML IJ SOLN
INTRAMUSCULAR | Status: DC | PRN
  Administered 2012-12-07: 10 mg via INTRAVENOUS

## 2012-12-07 MED ORDER — ROCURONIUM BROMIDE 100 MG/10ML IV SOLN
INTRAVENOUS | Status: DC | PRN
  Administered 2012-12-07: 40 mg via INTRAVENOUS

## 2012-12-07 MED ORDER — HYDROMORPHONE HCL PF 1 MG/ML IJ SOLN
INTRAMUSCULAR | Status: DC | PRN
  Administered 2012-12-07 (×2): 1 mg via INTRAVENOUS

## 2012-12-07 MED ORDER — DOCUSATE SODIUM 100 MG PO CAPS
100.0000 mg | ORAL_CAPSULE | Freq: Two times a day (BID) | ORAL | Status: DC
  Administered 2012-12-07: 100 mg via ORAL

## 2012-12-07 MED ORDER — RIVAROXABAN 10 MG PO TABS
10.0000 mg | ORAL_TABLET | Freq: Every day | ORAL | Status: DC
  Administered 2012-12-08: 10 mg via ORAL
  Filled 2012-12-07 (×2): qty 1

## 2012-12-07 MED ORDER — FERROUS SULFATE 325 (65 FE) MG PO TABS
325.0000 mg | ORAL_TABLET | Freq: Three times a day (TID) | ORAL | Status: DC
  Administered 2012-12-07 – 2012-12-08 (×2): 325 mg via ORAL
  Filled 2012-12-07 (×5): qty 1

## 2012-12-07 MED ORDER — MENTHOL 3 MG MT LOZG
1.0000 | LOZENGE | OROMUCOSAL | Status: DC | PRN
  Filled 2012-12-07: qty 9

## 2012-12-07 MED ORDER — BISACODYL 10 MG RE SUPP: 10.0000 mg | Freq: Every day | RECTAL | Status: DC | PRN

## 2012-12-07 MED ORDER — DIPHENHYDRAMINE HCL 25 MG PO CAPS: 25.0000 mg | ORAL_CAPSULE | Freq: Four times a day (QID) | ORAL | Status: DC | PRN

## 2012-12-07 MED ORDER — DEXAMETHASONE SODIUM PHOSPHATE 10 MG/ML IJ SOLN
10.0000 mg | Freq: Once | INTRAMUSCULAR | Status: AC
  Administered 2012-12-08: 10 mg via INTRAVENOUS
  Filled 2012-12-07: qty 1

## 2012-12-07 MED ORDER — SUCCINYLCHOLINE CHLORIDE 20 MG/ML IJ SOLN
INTRAMUSCULAR | Status: DC | PRN
  Administered 2012-12-07: 100 mg via INTRAVENOUS

## 2012-12-07 MED ORDER — NEOSTIGMINE METHYLSULFATE 1 MG/ML IJ SOLN
INTRAMUSCULAR | Status: DC | PRN
  Administered 2012-12-07: 5 mg via INTRAVENOUS

## 2012-12-07 MED ORDER — CHLORHEXIDINE GLUCONATE 4 % EX LIQD
60.0000 mL | Freq: Once | CUTANEOUS | Status: DC
  Filled 2012-12-07: qty 60

## 2012-12-07 MED ORDER — LACTATED RINGERS IV SOLN
INTRAVENOUS | Status: DC | PRN
  Administered 2012-12-07: 07:00:00 via INTRAVENOUS

## 2012-12-07 MED ORDER — ONDANSETRON HCL 4 MG/2ML IJ SOLN
4.0000 mg | Freq: Four times a day (QID) | INTRAMUSCULAR | Status: DC | PRN
  Administered 2012-12-07 (×2): 4 mg via INTRAVENOUS
  Filled 2012-12-07 (×2): qty 2

## 2012-12-07 MED ORDER — PROPOFOL 10 MG/ML IV BOLUS
INTRAVENOUS | Status: DC | PRN
  Administered 2012-12-07: 150 mg via INTRAVENOUS

## 2012-12-07 MED ORDER — SODIUM CHLORIDE 0.9 % IV SOLN
100.0000 mL/h | INTRAVENOUS | Status: DC
  Administered 2012-12-07 – 2012-12-08 (×2): 100 mL/h via INTRAVENOUS
  Filled 2012-12-07 (×5): qty 1000

## 2012-12-07 MED ORDER — PHENOL 1.4 % MT LIQD
1.0000 | OROMUCOSAL | Status: DC | PRN
  Filled 2012-12-07: qty 177

## 2012-12-07 MED ORDER — LACTATED RINGERS IV SOLN
INTRAVENOUS | Status: DC
  Administered 2012-12-07: 10:00:00 via INTRAVENOUS

## 2012-12-07 MED ORDER — ACETAMINOPHEN 10 MG/ML IV SOLN
INTRAVENOUS | Status: DC | PRN
  Administered 2012-12-07: 1000 mg via INTRAVENOUS

## 2012-12-07 MED ORDER — HYDROMORPHONE HCL PF 1 MG/ML IJ SOLN
0.2500 mg | INTRAMUSCULAR | Status: DC | PRN
  Administered 2012-12-07 (×2): 0.5 mg via INTRAVENOUS

## 2012-12-07 MED ORDER — LIDOCAINE HCL (CARDIAC) 20 MG/ML IV SOLN
INTRAVENOUS | Status: DC | PRN
  Administered 2012-12-07: 100 mg via INTRAVENOUS

## 2012-12-07 MED ORDER — MIDAZOLAM HCL 5 MG/5ML IJ SOLN
INTRAMUSCULAR | Status: DC | PRN
  Administered 2012-12-07: 2 mg via INTRAVENOUS

## 2012-12-07 MED ORDER — OXYCODONE HCL 5 MG PO TABS
5.0000 mg | ORAL_TABLET | ORAL | Status: DC | PRN
  Administered 2012-12-07 – 2012-12-08 (×5): 5 mg via ORAL
  Filled 2012-12-07 (×5): qty 1

## 2012-12-07 MED ORDER — ONDANSETRON HCL 4 MG PO TABS: 4.0000 mg | ORAL_TABLET | Freq: Four times a day (QID) | ORAL | Status: DC | PRN

## 2012-12-07 MED ORDER — CELECOXIB 200 MG PO CAPS
200.0000 mg | ORAL_CAPSULE | Freq: Two times a day (BID) | ORAL | Status: DC
  Filled 2012-12-07 (×4): qty 1

## 2012-12-07 MED ORDER — ALUM & MAG HYDROXIDE-SIMETH 200-200-20 MG/5ML PO SUSP: 30.0000 mL | ORAL | Status: DC | PRN

## 2012-12-07 MED ORDER — HYDROCODONE-ACETAMINOPHEN 7.5-325 MG PO TABS
1.0000 | ORAL_TABLET | ORAL | Status: DC
  Administered 2012-12-07 (×2): 2 via ORAL
  Filled 2012-12-07 (×2): qty 2

## 2012-12-07 MED ORDER — FLEET ENEMA 7-19 GM/118ML RE ENEM: 1.0000 | ENEMA | Freq: Once | RECTAL | Status: AC | PRN

## 2012-12-07 MED ORDER — METHOCARBAMOL 500 MG PO TABS: 500.0000 mg | ORAL_TABLET | Freq: Four times a day (QID) | ORAL | Status: DC | PRN

## 2012-12-07 MED ORDER — 0.9 % SODIUM CHLORIDE (POUR BTL) OPTIME
TOPICAL | Status: DC | PRN
  Administered 2012-12-07: 1000 mL

## 2012-12-07 MED ORDER — CEFAZOLIN SODIUM-DEXTROSE 2-3 GM-% IV SOLR
2.0000 g | Freq: Four times a day (QID) | INTRAVENOUS | Status: AC
  Administered 2012-12-07 (×2): 2 g via INTRAVENOUS
  Filled 2012-12-07 (×2): qty 50

## 2012-12-07 MED ORDER — GLYCOPYRROLATE 0.2 MG/ML IJ SOLN
INTRAMUSCULAR | Status: DC | PRN
  Administered 2012-12-07: 0.6 mg via INTRAVENOUS

## 2012-12-07 MED ORDER — ONDANSETRON HCL 4 MG/2ML IJ SOLN
INTRAMUSCULAR | Status: DC | PRN
  Administered 2012-12-07: 4 mg via INTRAVENOUS

## 2012-12-07 MED ORDER — ZOLPIDEM TARTRATE 5 MG PO TABS: 5.0000 mg | ORAL_TABLET | Freq: Every evening | ORAL | Status: DC | PRN

## 2012-12-07 MED ORDER — METOCLOPRAMIDE HCL 5 MG/ML IJ SOLN
5.0000 mg | Freq: Three times a day (TID) | INTRAMUSCULAR | Status: DC | PRN
  Administered 2012-12-07: 10 mg via INTRAVENOUS
  Filled 2012-12-07: qty 2

## 2012-12-07 MED ORDER — HYDROMORPHONE HCL PF 1 MG/ML IJ SOLN: 0.5000 mg | INTRAMUSCULAR | Status: DC | PRN

## 2012-12-07 MED ORDER — POLYETHYLENE GLYCOL 3350 17 G PO PACK
17.0000 g | PACK | Freq: Two times a day (BID) | ORAL | Status: DC
  Administered 2012-12-07: 17 g via ORAL

## 2012-12-07 MED ORDER — PROMETHAZINE HCL 25 MG/ML IJ SOLN: 12.5000 mg | Freq: Four times a day (QID) | INTRAMUSCULAR | Status: DC | PRN

## 2012-12-07 SURGICAL SUPPLY — 50 items
BAG ZIPLOCK 12X15 (MISCELLANEOUS) ×2 IMPLANT
BLADE SAW SGTL 18X1.27X75 (BLADE) IMPLANT
CLOTH BEACON ORANGE TIMEOUT ST (SAFETY) ×2 IMPLANT
CUP ACET PINNACLE SECTR 60MM (Hips) ×1 IMPLANT
DERMABOND ADVANCED (GAUZE/BANDAGES/DRESSINGS) ×1
DERMABOND ADVANCED .7 DNX12 (GAUZE/BANDAGES/DRESSINGS) ×1 IMPLANT
DRAPE INCISE IOBAN 85X60 (DRAPES) ×2 IMPLANT
DRAPE ORTHO SPLIT 77X108 STRL (DRAPES) ×2
DRAPE POUCH INSTRU U-SHP 10X18 (DRAPES) ×2 IMPLANT
DRAPE SURG 17X11 SM STRL (DRAPES) ×8 IMPLANT
DRAPE SURG ORHT 6 SPLT 77X108 (DRAPES) ×2 IMPLANT
DRSG AQUACEL AG ADV 3.5X10 (GAUZE/BANDAGES/DRESSINGS) ×4 IMPLANT
DRSG TEGADERM 4X4.75 (GAUZE/BANDAGES/DRESSINGS) ×2 IMPLANT
DURAPREP 26ML APPLICATOR (WOUND CARE) ×2 IMPLANT
ELECT BLADE TIP CTD 4 INCH (ELECTRODE) ×2 IMPLANT
ELECT REM PT RETURN 9FT ADLT (ELECTROSURGICAL) ×2
ELECTRODE REM PT RTRN 9FT ADLT (ELECTROSURGICAL) ×1 IMPLANT
ELIMINATOR HOLE APEX DEPUY (Hips) ×2 IMPLANT
EVACUATOR 1/8 PVC DRAIN (DRAIN) ×2 IMPLANT
FACESHIELD LNG OPTICON STERILE (SAFETY) ×8 IMPLANT
GAUZE SPONGE 2X2 8PLY STRL LF (GAUZE/BANDAGES/DRESSINGS) ×1 IMPLANT
GLOVE BIOGEL PI IND STRL 7.5 (GLOVE) ×1 IMPLANT
GLOVE BIOGEL PI IND STRL 8 (GLOVE) ×1 IMPLANT
GLOVE BIOGEL PI INDICATOR 7.5 (GLOVE) ×1
GLOVE BIOGEL PI INDICATOR 8 (GLOVE) ×1
GLOVE ECLIPSE 8.0 STRL XLNG CF (GLOVE) ×2 IMPLANT
GLOVE ORTHO TXT STRL SZ7.5 (GLOVE) ×4 IMPLANT
GOWN BRE IMP PREV XXLGXLNG (GOWN DISPOSABLE) ×2 IMPLANT
GOWN STRL NON-REIN LRG LVL3 (GOWN DISPOSABLE) ×6 IMPLANT
HEAD CERAMIC 36 PLUS 8.5 12 14 (Hips) ×2 IMPLANT
KIT BASIN OR (CUSTOM PROCEDURE TRAY) ×2 IMPLANT
LINER NEUTRAL 58X36MM PLUS4 ×2 IMPLANT
MANIFOLD NEPTUNE II (INSTRUMENTS) ×2 IMPLANT
NS IRRIG 1000ML POUR BTL (IV SOLUTION) ×2 IMPLANT
PACK TOTAL JOINT (CUSTOM PROCEDURE TRAY) ×2 IMPLANT
PINNSECTOR W/GRIP ACE CUP 60MM (Hips) ×2 IMPLANT
POSITIONER SURGICAL ARM (MISCELLANEOUS) ×2 IMPLANT
SCREW 6.5MMX30MM (Screw) ×2 IMPLANT
SPONGE GAUZE 2X2 STER 10/PKG (GAUZE/BANDAGES/DRESSINGS) ×1
SPONGE LAP 18X18 X RAY DECT (DISPOSABLE) IMPLANT
SPONGE LAP 4X18 X RAY DECT (DISPOSABLE) IMPLANT
STAPLER VISISTAT 35W (STAPLE) ×2 IMPLANT
SUCTION FRAZIER TIP 10 FR DISP (SUCTIONS) ×2 IMPLANT
SUT VIC AB 1 CT1 36 (SUTURE) ×6 IMPLANT
SUT VIC AB 2-0 CT1 27 (SUTURE) ×3
SUT VIC AB 2-0 CT1 TAPERPNT 27 (SUTURE) ×3 IMPLANT
SUT VLOC 180 0 24IN GS25 (SUTURE) ×2 IMPLANT
TOWEL OR 17X26 10 PK STRL BLUE (TOWEL DISPOSABLE) ×4 IMPLANT
TRAY FOLEY CATH 14FRSI W/METER (CATHETERS) ×2 IMPLANT
WATER STERILE IRR 1500ML POUR (IV SOLUTION) ×4 IMPLANT

## 2012-12-07 NOTE — Progress Notes (Signed)
PT Cancellation Note  Patient Details Name: Terry Savage MRN: [id number] DOB: [birth year]   Cancelled Treatment:    Reason Eval/Treat Not Completed: Medical issues which prohibited therapy (nause before attempting to sit at edge of bed.)   Rada Hay 12/07/2012, 4:13 PM

## 2012-12-07 NOTE — Transfer of Care (Signed)
Immediate Anesthesia Transfer of Care Note  Patient: Terry Savage  Procedure(s) Performed: Procedure(s) (LRB): TOTAL HIP REVISION (Right)  Patient Location: PACU  Anesthesia Type: General  Level of Consciousness: sedated, patient cooperative and responds to stimulaton  Airway & Oxygen Therapy: Patient Spontanous Breathing and Patient connected to face mask oxgen  Post-op Assessment: Report given to PACU RN and Post -op Vital signs reviewed and stable  Post vital signs: Reviewed and stable  Complications: No apparent anesthesia complications

## 2012-12-07 NOTE — Progress Notes (Signed)
Utilization review completed.  

## 2012-12-07 NOTE — Op Note (Signed)
NAMEBRODIE, CORRELL             ACCOUNT NO.:  [id number]  MEDICAL RECORD NO.:  [id number]  LOCATION:  [facility]                         FACILITY:  [facility]  PHYSICIAN:  Madlyn Frankel. Charlann Boxer, M.D.  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  PROCEDURE:  Revision of right total hip replacement focused on the acetabulum with a size 60 pinnacle Sector Gription cup with a single cancellous screw, a 36+ 4 neutral AltrX liner, 36+ 8.5 delta ceramic ball.  SURGEON:  Madlyn Frankel. Charlann Boxer, M.D.  ASSISTANT:  Lanney Gins, PA-C.  Note that Mr. Carmon Sails was present for the entirety of the case from preoperative positioning, perioperative management of the operative extremity, general facilitation of the case, and primary wound closure.  ANESTHESIA:  General.  SPECIMENS:  None.  FINDINGS:  The patient was noted to have clear synovial fluid. Acetabular component was less anteverted that sometimes would be required for posterior approach, was only real finding.  DRAINS:  One medium Hemovac.  BLOOD LOSS:  About 250 mL.  INDICATION FOR THE PROCEDURE:  Mr. Traynham is a 66 year old male who was referred from my partner, Dr. Jene Every for evaluation of the right hip with recurrent instability recently.  After reviewing with him risks and benefits of further nonoperative care versus operative intervention, we opted for operative intervention.  At this point, given his age of 12, the plan was to revise the acetabulum and redirect it and orient it based on preoperative radiographic evaluation including cross-table lateral.  Given these findings and discussion of the risks of infection,  DVT, further rates of dislocation for revision setting, consent was obtained for benefit of stability of the hip.  PROCEDURE IN DETAIL:  The patient was brought to the operative theater. Once adequate anesthesia preoperative antibiotics, Ancef administered. He was positioned into the left lateral decubitus position with right side up.  The right lower extremity was then prepped and draped in a sterile fashion.  A time-out was performed identifying the patient, planned procedure, and extremity.  A lateral incision was made over his old incision via.  Sharp dissection, soft tissue planes created.  The posterior aspect of the hip was exposed through an incision through the iliotibial band and gluteal fascia.  The patient's posterior pseudocapsule was excised encountering clear synovial fluid.  At this point, the posterior 2/3rds of the hip was exposed.  Retractors placed.  The hip was then dislocated and the femoral head was removed.  The femoral head was noted to be a 36+ 5 metal ball.  At this point, once I exposed further the rim of the acetabulum, the old polyethylene insert was removed, which was a 36+ 4 10-degree lipped liner.  I then removed 2 cancellous screws and using the Explant system, removed the acetabular shell, it was a 56 mm cup.  There was minimal bone loss, however, when I reamed, I was able to get some purchase.  I  got good purchase with a 59 mm reamer with minimal bone removal at this point due to the fact that we were already down to the medial wall.  I selected a 60 mm cup, which was impacted at this point with more forward flexion in the initial cup that was beneath the anterior rim anteriorly probably 25-30 degrees of forward flexion.  I did a trial reduction choosing to upsize from a 36+ 5 ball to 36+ 8.5 ball with a neutral liner.  I found combined anteversion was greater than 45 degrees.  There was no evidence any subluxation with hip flexion, internal  rotation nor the sleep position.  Given these findings, I went ahead and removed the trial components.  I placed a single cancellous screw into the ilium and placed a hole eliminator and then impacted in a 36+ 4 neutral AltrX liner.  The 36+ 8.5 delta ceramic ball was then impacted onto a clean and dry trunnion and the hip was reduced.  The wound was irrigated throughout the case and again at this point, I reapproximated some of the posterior pseudocapsule back to the superior leaflet, placed a medium Hemovac drain deep the iliotibial band and gluteal fascia were then reapproximated using #1 Vicryl and 0 V-Loc suture.  The remainder of the wound was closed with 2-0 Vicryl and running 4-0 Monocryl.  The hip was then cleaned, dried, and dressed sterilely using Dermabond and Aquacel dressing.  Drain site was dressed separately.  The patient was then extubated and brought to the recovery room in stable condition tolerating the procedure well.     Madlyn Frankel Charlann Boxer, M.D.     MDO/MEDQ  D:  12/07/2012  T:  12/07/2012  Job:  [id number]

## 2012-12-07 NOTE — Preoperative (Signed)
Beta Blockers   Reason not to administer Beta Blockers:Not Applicable 

## 2012-12-07 NOTE — Interval H&P Note (Signed)
History and Physical Interval Note:  12/07/2012 7:24 AM  Terry Savage  has presented today for surgery, with the diagnosis of FAILED RIGHT TOTAL HIP   The various methods of treatment have been discussed with the patient and family. After consideration of risks, benefits and other options for treatment, the patient has consented to  Procedure(s) (LRB) with comments: TOTAL HIP REVISION (Right) as a surgical intervention .  The patient's history has been reviewed, patient examined, no change in status, stable for surgery.  I have reviewed the patient's chart and labs.  Questions were answered to the patient's satisfaction.     [name]

## 2012-12-07 NOTE — Brief Op Note (Signed)
12/07/2012  9:04 AM  PATIENT:  Charmaine Downs  66 y.o. 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  PROCEDURE:  Procedure(s) (LRB) with comments: TOTAL HIP REVISION (Right) - acetabular revision  SURGEON:  Surgeon(s) and Role:    * [name], MD - Primary  PHYSICIAN ASSISTANT: Lanney Gins, PA-C  ANESTHESIA:   general  EBL:  Total I/O In: -  Out: 600 [Urine:350; Blood:250]  BLOOD ADMINISTERED:none  DRAINS: (1 medium) Hemovact drain(s) in the right hip with  Suction Open   LOCAL MEDICATIONS USED:  NONE  SPECIMEN:  No Specimen  DISPOSITION OF SPECIMEN:  N/A  COUNTS:  YES  TOURNIQUET:  * No tourniquets in log *  DICTATION: .Other Dictation: Dictation Number [id number]  PLAN OF CARE: Admit to inpatient   PATIENT DISPOSITION:  PACU - hemodynamically stable.   Delay start of Pharmacological VTE agent (>24hrs) due to surgical blood loss or risk of bleeding: no

## 2012-12-07 NOTE — Anesthesia Postprocedure Evaluation (Signed)
  Anesthesia Post-op Note  Patient: Terry Savage  Procedure(s) Performed: Procedure(s) (LRB): TOTAL HIP REVISION (Right)  Patient Location: PACU  Anesthesia Type: General  Level of Consciousness: awake and alert   Airway and Oxygen Therapy: Patient Spontanous Breathing  Post-op Pain: mild  Post-op Assessment: Post-op Vital signs reviewed, Patient's Cardiovascular Status Stable, Respiratory Function Stable, Patent Airway and No signs of Nausea or vomiting  Last Vitals:  Filed Vitals:   12/07/12 0522  BP: 118/78  Pulse: 74  Temp: 35.8 C  Resp: 18    Post-op Vital Signs: stable   Complications: No apparent anesthesia complications

## 2012-12-07 NOTE — Anesthesia Preprocedure Evaluation (Signed)
Anesthesia Evaluation  Patient identified by MRN, date of birth, ID band Patient awake    Reviewed: Allergy & Precautions, H&P , NPO status , Patient's Chart, lab work & pertinent test results  Airway Mallampati: II TM Distance: >3 FB Neck ROM: Full    Dental No notable dental hx.    Pulmonary neg pulmonary ROS,  breath sounds clear to auscultation  Pulmonary exam normal       Cardiovascular negative cardio ROS  Rhythm:Regular Rate:Normal     Neuro/Psych negative neurological ROS  negative psych ROS   GI/Hepatic negative GI ROS, Neg liver ROS,   Endo/Other  negative endocrine ROS  Renal/GU negative Renal ROS  negative genitourinary   Musculoskeletal negative musculoskeletal ROS (+)   Abdominal   Peds negative pediatric ROS (+)  Hematology negative hematology ROS (+)   Anesthesia Other Findings   Reproductive/Obstetrics negative OB ROS                           Anesthesia Physical Anesthesia Plan  ASA: I  Anesthesia Plan: General   Post-op Pain Management:    Induction: Intravenous  Airway Management Planned: Oral ETT  Additional Equipment:   Intra-op Plan:   Post-operative Plan: Extubation in OR  Informed Consent: I have reviewed the patients History and Physical, chart, labs and discussed the procedure including the risks, benefits and alternatives for the proposed anesthesia with the patient or authorized representative who has indicated his/her understanding and acceptance.   Dental advisory given  Plan Discussed with: CRNA and Surgeon  Anesthesia Plan Comments:         Anesthesia Quick Evaluation

## 2012-12-08 DIAGNOSIS — E663 Overweight: Secondary | ICD-10-CM

## 2012-12-08 DIAGNOSIS — D500 Iron deficiency anemia secondary to blood loss (chronic): Secondary | ICD-10-CM

## 2012-12-08 LAB — CBC
HCT: 35.8 % — ABNORMAL LOW (ref 39.0–52.0)
MCH: 30.9 pg (ref 26.0–34.0)
MCHC: 34.6 g/dL (ref 30.0–36.0)
MCV: 89.3 fL (ref 78.0–100.0)
RDW: 12.4 % (ref 11.5–15.5)
WBC: 10.2 10*3/uL (ref 4.0–10.5)

## 2012-12-08 LAB — BASIC METABOLIC PANEL
BUN: 15 mg/dL (ref 6–23)
Calcium: 8.5 mg/dL (ref 8.4–10.5)
Chloride: 103 mEq/L (ref 96–112)
Creatinine, Ser: 0.87 mg/dL (ref 0.50–1.35)
GFR calc Af Amer: >90 mL/min (ref >90)

## 2012-12-08 MED ORDER — DIPHENHYDRAMINE HCL 25 MG PO CAPS: 25.0000 mg | ORAL_CAPSULE | Freq: Four times a day (QID) | ORAL | Status: AC | PRN

## 2012-12-08 MED ORDER — DSS 100 MG PO CAPS: 100.0000 mg | ORAL_CAPSULE | Freq: Two times a day (BID) | ORAL | Status: AC

## 2012-12-08 MED ORDER — OXYCODONE HCL 5 MG PO TABS: 5.0000 mg | ORAL_TABLET | ORAL | Status: AC | PRN

## 2012-12-08 MED ORDER — ASPIRIN EC 325 MG PO TBEC: 325.0000 mg | DELAYED_RELEASE_TABLET | Freq: Two times a day (BID) | ORAL | Status: AC

## 2012-12-08 MED ORDER — POLYETHYLENE GLYCOL 3350 17 G PO PACK: 17.0000 g | PACK | Freq: Two times a day (BID) | ORAL | Status: AC

## 2012-12-08 MED ORDER — METHOCARBAMOL 500 MG PO TABS: 500.0000 mg | ORAL_TABLET | Freq: Four times a day (QID) | ORAL | Status: AC | PRN

## 2012-12-08 MED ORDER — FERROUS SULFATE 325 (65 FE) MG PO TABS: 325.0000 mg | ORAL_TABLET | Freq: Three times a day (TID) | ORAL | Status: AC

## 2012-12-08 NOTE — Progress Notes (Signed)
   Subjective: 1 Day Post-Op Procedure(s) (LRB): TOTAL HIP REVISION (Right)   Patient reports pain as mild, pain well controlled. Was having nausea with Norco changed to oxycodone and nausea has resolved. No events throughout the night. Feels that he is ready for PT and will be ready for discharge after PT if he does well.  Objective:   VITALS:   Filed Vitals:   12/08/12 0543  BP: 117/59  Pulse: 78  Temp: 98.2 F (36.8 C)  Resp: 16    Neurovascular intact Dorsiflexion/Plantar flexion intact Incision: dressing C/D/I No cellulitis present Compartment soft  LABS  Basename 12/08/12 0440  HGB 12.4*  HCT 35.8*  WBC 10.2  PLT 214     Basename 12/08/12 0440  NA 136  K 4.4  BUN 15  CREATININE 0.87  GLUCOSE 119*     Assessment/Plan: 1 Day Post-Op Procedure(s) (LRB): TOTAL HIP REVISION (Right) HV drain d/c'ed Foley cath d/c'ed Advance diet Up with therapy D/C IV fluids Discharge home with home health if he does well with PT Follow [facility] in 2 [facility]. Follow up with OLIN,Nayib Remer D in 2 weeks.  Contact information:  [facility] [address] [place] [address] [phone]    Expected ABLA  Treated with iron and will observe  Overweight (BMI 25-29.9)  Estimated Body mass index is 29.61 kg/(m^2) as calculated from the following:   Height as of this encounter: 6\' 1" (1.854 m).   Weight as of this encounter: 224 lb 6.9 oz(101.8 kg). Patient also counseled that weight may inhibit the healing process Patient counseled that losing weight will help with future health issues      Anastasio Auerbach. Jaycen Vercher   PAC  12/08/2012, 7:48 AM

## 2012-12-08 NOTE — Progress Notes (Signed)
OT Cancellation Note  Patient Details Name: KYZEN HORN MRN: [id number] DOB: 10/26/1946   Cancelled Treatment:    Reason Eval/Treat Not Completed: Other (comment) (OT screen) Pt states he has all DME and AE as well as wife can assist at d/c. Pt states he doesn't feel he needs acute OT.  Lennox Laity [phone] 12/08/2012, 11:00 AM

## 2012-12-08 NOTE — Evaluation (Signed)
Physical Therapy Evaluation Patient Details Name: Terry Savage MRN: [id number] DOB: 1946/08/24 Today's Date: 12/08/2012 Time: 0810-0850 PT Time Calculation (min): 40 min  PT Assessment / Plan / Recommendation Clinical Impression  66 y.o. male with h/o R THA and 3 dislocations admitted for revision of R THA. REviewed posterior hip precautions in detail. Pt ambulated 300' with RW independently, has needed DME, and support from wife at home. OK to DC home from PT standpoint, with HHPT.     PT Assessment  Patient needs continued PT services    Follow Up Recommendations  Home health PT    Does the patient have the potential to tolerate intense rehabilitation      Barriers to Discharge        Equipment Recommendations  None recommended by PT    Recommendations for Other Services     Frequency 7X/week    Precautions / Restrictions Precautions Precautions: Posterior Hip Precaution Booklet Issued: Yes (comment) Precaution Comments: reviewed posterior hip precautions in detail 2* h/o 3 dislocations Restrictions Weight Bearing Restrictions: No Other Position/Activity Restrictions: WBAT   Pertinent Vitals/Pain *2-3/10 R hip with walking Pain meds requested, ice applied**      Mobility  Bed Mobility Bed Mobility: Supine to Sit Supine to Sit: 4: Min assist Details for Bed Mobility Assistance: min A to support RLE Transfers Transfers: Sit to Stand;Stand to Sit Sit to Stand: 5: Supervision;From bed Stand to Sit: 5: Supervision;To chair/3-in-1;With upper extremity assist Details for Transfer Assistance: VCs hand placement Ambulation/Gait Ambulation/Gait Assistance: 6: Modified independent (Device/Increase time) Ambulation Distance (Feet): 300 Feet Assistive device: Rolling walker Gait Pattern: Within Functional Limits    Shoulder Instructions     Exercises Total Joint Exercises Ankle Circles/Pumps: AROM;Both;10 reps;Supine Quad Sets: AROM;Both;5 reps;Supine Short  Arc Quad: AAROM;Right;10 reps;Supine Heel Slides: AAROM;Right;10 reps;Supine Hip ABduction/ADduction: AAROM;Right;10 reps;Supine Long Arc Quad: AROM;Right;10 reps;Seated   PT Diagnosis: Acute pain  PT Problem List: Pain;Decreased activity tolerance;Decreased knowledge of precautions PT Treatment Interventions: Stair training;Therapeutic exercise;Patient/family education;Functional mobility training   PT Goals Acute Rehab PT Goals PT Goal Formulation: With patient Time For Goal Achievement: 12/15/12 Potential to Achieve Goals: Good Pt will go Supine/Side to Sit: Independently;with HOB 0 degrees PT Goal: Supine/Side to Sit - Progress: Goal set today Pt will go Sit to Stand: with modified independence PT Goal: Sit to Stand - Progress: Goal set today Pt will Go Up / Down Stairs: 1-2 stairs;with modified independence PT Goal: Up/Down Stairs - Progress: Goal set today Pt will Perform Home Exercise Program: Independently PT Goal: Perform Home Exercise Program - Progress: Goal set today  Visit Information  Last PT Received On: 12/08/12 Assistance Needed: +1    Subjective Data  Subjective: I can move my leg pretty well, I couldn't do that with the first surgery.  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 Communication Communication: No difficulties    Cognition  Overall Cognitive Status: Appears within functional limits for tasks assessed/performed Arousal/Alertness:  Awake/alert Orientation Level: Appears intact for tasks assessed Behavior During Session: [facility] for tasks performed    Extremity/Trunk Assessment  Right Upper Extremity Assessment RUE ROM/Strength/Tone: [facility] for tasks assessed Left Upper Extremity Assessment LUE ROM/Strength/Tone: WFL for tasks assessed Right Lower Extremity Assessment RLE ROM/Strength/Tone: Deficits RLE ROM/Strength/Tone Deficits: knee ext 3/5, hip +2/5, AAROM WFL for hip, AROM WNL knee/ankle RLE Sensation: WFL - Light Touch RLE Coordination: WFL - gross/fine motor Left Lower Extremity Assessment LLE ROM/Strength/Tone: Within functional levels LLE Sensation: WFL - Light Touch LLE Coordination: WFL - gross/fine motor Trunk Assessment Trunk Assessment: Normal   Balance    End of Session PT - End of Session Equipment Utilized During Treatment: Gait belt Activity Tolerance: Patient tolerated treatment well Patient left: in chair Nurse Communication: Mobility status  GP     [name] 12/08/2012, 8:59 AM  [phone]

## 2012-12-08 NOTE — Discharge Summary (Signed)
Physician Discharge Summary  Patient ID: Terry Savage MRN: [id number] DOB/AGE: 67-Jan-1947 67 y.o.  Admit date: 12/07/2012 Discharge date:  12/08/2012   Procedures:  Procedure(s) (LRB): TOTAL HIP REVISION (Right)  Attending Physician:  Dr. Durene Romans   Admission Diagnoses:   Right hip pain with multiple dislocations s/p total hip arthroplasty  Discharge Diagnoses:  Principal Problem:  *S/P right hip revision Active Problems:  Expected blood loss anemia  Overweight (BMI 25.0-29.9) Arthritis    HPI: Pt is a 66 y.o. male complaining of right hip pain for a year. Pain had continually increased since the beginning his first dislocation in July of 2013. Since that time he has had 2 more dislocations. X-rays in the clinic show previous total hip arthroplasty of the right hip. Pt has tried various conservative treatments which have failed to alleviate their symptoms. Various options are discussed with the patient. Risks, benefits and expectations were discussed with the patient. Patient understand the risks, benefits and expectations and wishes to proceed with surgery.   PCP: No primary provider on file.   Discharged Condition: good  Hospital Course:  Patient underwent the above stated procedure on 12/07/2012. Patient tolerated the procedure well and brought to the recovery room in good condition and subsequently to the floor.  POD #1 BP: 117/59 ; Pulse: 78 ; Temp: 98.2 F (36.8 C) ; Resp: 16  Pt's foley was removed, as well as the hemovac drain removed. IV was changed to a saline lock. Patient reports pain as mild, pain well controlled. Was having nausea with Norco changed to oxycodone and nausea has resolved. No events throughout the night. Feels that he is ready for PT and will be ready for discharge after PT if he does well. Neurovascular intact, dorsiflexion/plantar flexion intact, incision: dressing C/D/I, no cellulitis present and compartment soft.   LABS  Basename   12/08/12 0440   HGB  12.4  HCT  35.8    Discharge Exam: General appearance: alert, cooperative and no distress Extremities: Homans sign is negative, no sign of DVT, no edema, redness or tenderness in the calves or thighs and no ulcers, gangrene or trophic changes  Disposition:  Home or Self Care with follow up in 2 weeks   Follow-up Information    Follow up with [name], MD. Schedule an appointment as soon as possible for a visit in 2 weeks.   Contact information:   [address] [place] [address] [phone]          Discharge Orders    Future Orders Please Complete By Expires   Diet - low sodium heart healthy      Call MD / Call 911      Comments:   If you experience chest pain or shortness of breath, CALL 911 and be transported to the hospital emergency room.  If you develope a fever above 101 F, pus (white drainage) or increased drainage or redness at the wound, or calf pain, call your surgeon's office.   Discharge instructions      Comments:   Maintain surgical dressing for 10-14 days, then replace with gauze and tape. Keep the area dry and clean until follow up. Follow up in 2 weeks at [facility]. Call with any questions or concerns.   Constipation Prevention      Comments:   Drink plenty of fluids.  Prune juice may be helpful.  You may use a stool softener, such as Colace (over the counter) 100 mg  twice a day.  Use MiraLax (over the counter) for constipation as needed.   Increase activity slowly as tolerated      Change dressing      Comments:   Maintain surgical dressing for 8 days, then replace with 4x4 guaze and tape. Keep the area dry and clean.   TED hose      Comments:   Use stockings (TED hose) for 2 weeks on both leg(s).  You may remove them at night for sleeping.   Weight bearing as tolerated      Comments:   Right leg      Current Discharge Medication List    START taking these medications   Details    diphenhydrAMINE (BENADRYL) 25 mg capsule Take 1 capsule (25 mg total) by mouth every 6 (six) hours as needed for itching, allergies or sleep. Qty: 30 capsule    docusate sodium 100 MG CAPS Take 100 mg by mouth 2 (two) times daily. Qty: 10 capsule    ferrous sulfate 325 (65 FE) MG tablet Take 1 tablet (325 mg total) by mouth 3 (three) times daily after meals.    oxyCODONE (OXY IR/ROXICODONE) 5 MG immediate release tablet Take 1-3 tablets (5-15 mg total) by mouth every 4 (four) hours as needed for pain. Qty: 80 tablet, Refills: 0    polyethylene glycol (MIRALAX / GLYCOLAX) packet Take 17 g by mouth 2 (two) times daily. Qty: 14 each      CONTINUE these medications which have CHANGED   Details  aspirin EC 325 MG tablet Take 1 tablet (325 mg total) by mouth 2 (two) times daily. X 4 weeks Qty: 60 tablet, Refills: 0    methocarbamol (ROBAXIN) 500 MG tablet Take 1 tablet (500 mg total) by mouth every 6 (six) hours as needed.      STOP taking these medications     ferrous fumarate (HEMOCYTE - 106 MG FE) 325 (106 FE) MG TABS Comments:  Reason for Stopping:           Signed: Anastasio Auerbach. Dsean Vantol   PAC  12/08/2012, 8:03 AM

## 2012-12-08 NOTE — Care Management Note (Signed)
    Page 1 of 2   12/08/2012     6:12:09 PM   CARE MANAGEMENT NOTE 12/08/2012  Patient:  Terry Savage,Terry Savage   Account Number:  [id number]  Date Initiated:  12/08/2012  Documentation initiated by:  Colleen Can  Subjective/Objective Assessment:   DX FAILED RIGHT TOTAL HIP; REVISIONM RT TOTAL HIP ARTHROPLASTY     Action/Plan:   CM SPOKE WITH PATIENT. pLANS ARE FOR RETURN TO HOME WHERE SPOUSE WILL BE CAREGIVER. Already has DME. Requesting [facility] for [facility] services. Has used them in the past   Anticipated DC Date:  12/08/2012   Anticipated DC Plan:  HOME W HOME HEALTH SERVICES  In-house referral  NA      DC Planning Services  CM consult      [facility] Choice  HOME HEALTH   Choice offered to / List presented to:  C-1 Patient   DME arranged  NA      DME agency  NA     HH arranged  HH-2 PT      HH agency  [facility]   Status of service:  Completed, signed off Medicare Important Message given?  NA - LOS <3 / Initial given by admissions (If response is "NO", the following Medicare IM given date fields will be blank) Date Medicare IM given:   Date Additional Medicare IM given:    Discharge Disposition:  HOME W HOME HEALTH SERVICES  Per UR Regulation:    If discussed at Long Length of Stay Meetings, dates discussed:    Comments:  12/08/2012 [facility] Fitz Matsuo BSN RN CCM [phone] [facility] can provide [facility] servies with start date of day after discharge.

## 2012-12-08 NOTE — Progress Notes (Addendum)
Physical Therapy Treatment Patient Details Name: Terry Savage MRN: [id number] DOB: 12-23-1945 Today's Date: 12/08/2012 Time: 1048-1100 PT Time Calculation (min): 12 min  PT Assessment / Plan / Recommendation Comments on Treatment Session  Pt doing well with mobilty, he ambulated 300' with RW independently. Verbally reviewed sequencing/technique for going up/down 1 step, pt stated he recalls how to do this and declined practicing it. Ready to DC home from PT standpoint.     Follow Up Recommendations  Home health PT     Does the patient have the potential to tolerate intense rehabilitation     Barriers to Discharge        Equipment Recommendations  None recommended by PT    Recommendations for Other Services    Frequency 7X/week   Plan Discharge plan remains appropriate;Frequency remains appropriate    Precautions / Restrictions Precautions Precautions: Posterior Hip Precaution Booklet Issued: Yes (comment) Precaution Comments: reviewed posterior hip precautions in detail 2* h/o 3 dislocations Restrictions Weight Bearing Restrictions: No Other Position/Activity Restrictions: WBAT   Pertinent Vitals/Pain **1-2/10 R hip Ice applied*    Mobility  Bed Mobility Bed Mobility: Sit to Supine Supine to Sit: 4: Min assist Sit to Supine: 6: Modified independent (Device/Increase time);HOB elevated;With rail Details for Bed Mobility Assistance: min A to support RLE Transfers Transfers: Sit to Stand;Stand to Sit Sit to Stand: 6: Modified independent (Device/Increase time);From chair/3-in-1;With armrests;With upper extremity assist Stand to Sit: With upper extremity assist;To bed;6: Modified independent (Device/Increase time) Details for Transfer Assistance: VCs hand placement Ambulation/Gait Ambulation/Gait Assistance: 6: Modified independent (Device/Increase time) Ambulation Distance (Feet): 300 Feet Assistive device: Rolling walker Gait Pattern: Within Functional Limits        PT Diagnosis: Acute pain  PT Problem List: Pain;Decreased activity tolerance;Decreased knowledge of precautions PT Treatment Interventions: Stair training;Therapeutic exercise;Patient/family education;Functional mobility training   PT Goals Acute Rehab PT Goals PT Goal Formulation: With patient Time For Goal Achievement: 12/15/12 Potential to Achieve Goals: Good Pt will go Supine/Side to Sit: Independently;with HOB 0 degrees PT Goal: Supine/Side to Sit - Progress: Goal set today Pt will go Sit to Stand: with modified independence PT Goal: Sit to Stand - Progress: Met Pt will Go Up / Down Stairs: 1-2 stairs;with modified independence PT Goal: Up/Down Stairs - Progress: Discontinued (comment) (verbally reviewed technique, pt declined practice) Pt will Perform Home Exercise Program: Independently PT Goal: Perform Home Exercise Program - Progress: Goal set today  Visit Information  Last PT Received On: 12/08/12 Assistance Needed: +1    Subjective Data  Subjective: I'm ready to get back to bed.  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   Cognition  Overall Cognitive Status: Appears within functional limits for tasks assessed/performed Arousal/Alertness: Awake/alert Orientation Level: Appears intact for tasks assessed Behavior During Session: [facility] for tasks performed    Balance     End of Session PT - End of Session Equipment Utilized During Treatment: Gait belt Activity Tolerance: Patient tolerated treatment well Patient left: in bed Nurse Communication: Mobility status   GP     [name] 12/08/2012, 11:08 AM [phone]

## 2016-07-11 ENCOUNTER — Emergency Department (HOSPITAL_BASED_OUTPATIENT_CLINIC_OR_DEPARTMENT_OTHER)
Admission: EM | Admit: 2016-07-11 | Discharge: 2016-07-11 | Disposition: A | Discharge status: Home / Self Care | Payer: Commercial Managed Care - HMO | Attending: Emergency Medicine | Admitting: Emergency Medicine

## 2016-07-11 ENCOUNTER — Encounter (HOSPITAL_BASED_OUTPATIENT_CLINIC_OR_DEPARTMENT_OTHER): Payer: Self-pay | Admitting: *Deleted

## 2016-07-11 DIAGNOSIS — M1990 Unspecified osteoarthritis, unspecified site: Secondary | ICD-10-CM | POA: Insufficient documentation

## 2016-07-11 DIAGNOSIS — M779 Enthesopathy, unspecified: Secondary | ICD-10-CM | POA: Diagnosis not present

## 2016-07-11 DIAGNOSIS — Z7982 Long term (current) use of aspirin: Secondary | ICD-10-CM | POA: Insufficient documentation

## 2016-07-11 DIAGNOSIS — M79601 Pain in right arm: Secondary | ICD-10-CM | POA: Diagnosis present

## 2016-07-11 NOTE — ED Triage Notes (Signed)
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

## 2016-07-11 NOTE — ED Provider Notes (Signed)
[facility] Provider Note   CSN: [id number] Arrival date & time: 07/11/16  1120  First Provider Contact:  None       History   Chief Complaint Chief Complaint  Patient presents with  . Arm Pain    HPI Terry Savage is a 70 y.o. male.  Patient presents with pain in his right arm. He is right-hand-dominant. He was doing some painting about 3 days ago and over last 2 days he's had pain in his right arm. He doesn't feel like he was doing a lot of painting. This just a small area that he put 3 coats on. However he's had a throbbing pain from his right shoulder down to his right elbow. He denies any neck pain. He states he feels a little tingling in his right hand but denies any weakness in the arm. He is not taking anything at home for symptoms. He denies any swelling of the arm.    Arm Pain  Pertinent negatives include no headaches.    Past Medical History:  Diagnosis Date  . 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 Expected blood loss anemia 12/08/2012  . Overweight (BMI 25.0-29.9) 12/08/2012  . 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 JOINT REPLACEMENT     01/2010 RT HIP REPLACEMENT  . SURGERY FOR RT HIP DISLOCATIONS X 3     . TOTAL HIP REVISION  12/07/2012   Procedure: TOTAL HIP REVISION;  Surgeon: [name], MD;  Location: [facility];  Service: Orthopedics;  Laterality: Right;  acetabular revision       Home Medications    Prior to Admission medications   Medication Sig Start Date End Date Taking? Authorizing Provider  aspirin EC 325 MG tablet Take 1 tablet (325 mg total) by mouth 2 (two) times daily. X 4 weeks 12/08/12   Lanney Gins, PA-C  diphenhydrAMINE (BENADRYL) 25 mg capsule Take 1 capsule (25 mg total) by mouth every 6 (six) hours as needed for itching, allergies or sleep. 12/08/12   Lanney Gins,  PA-C  docusate sodium 100 MG CAPS Take 100 mg by mouth 2 (two) times daily. 12/08/12   Lanney Gins, PA-C  ferrous sulfate 325 (65 FE) MG tablet Take 1 tablet (325 mg total) by mouth 3 (three) times daily after meals. 12/08/12   Lanney Gins, PA-C  methocarbamol (ROBAXIN) 500 MG tablet Take 1 tablet (500 mg total) by mouth every 6 (six) hours as needed. 12/08/12   Lanney Gins, PA-C  oxyCODONE (OXY IR/ROXICODONE) 5 MG immediate release tablet Take 1-3 tablets (5-15 mg total) by mouth every 4 (four) hours as needed for pain. 12/08/12   Lanney Gins, PA-C  polyethylene glycol (MIRALAX / GLYCOLAX) packet Take 17 g by mouth 2 (two) times daily. 12/08/12   Lanney Gins, PA-C    Family History History reviewed. No pertinent family history.  Social History Social History  Substance Use Topics  . Smoking status: Never Smoker  . Smokeless tobacco: Never Used  . Alcohol use Yes     Comment: occasional     Allergies   Review of patient's allergies indicates no known allergies.   Review of Systems Review of Systems  Constitutional: Negative for fever.  Gastrointestinal: Negative for nausea and vomiting.  Musculoskeletal: Positive for arthralgias and myalgias. Negative for back pain,  joint swelling and neck pain.  Skin: Negative for wound.  Neurological: Negative for weakness, numbness and headaches.     Physical Exam Updated Vital Signs BP 141/67 (BP Location: Left Arm)   Pulse 66   Temp 98.1 F (36.7 C) (Oral)   Resp 20   Ht 6\' 1"  (1.854 m)   Wt 217 lb (98.4 kg)   SpO2 98%   BMI 28.63 kg/m   Physical Exam  Constitutional: He is oriented to person, place, and time. He appears well-developed and well-nourished.  HENT:  Head: Normocephalic and atraumatic.  Neck: Normal range of motion. Neck supple.  Cardiovascular: Normal rate.   Pulmonary/Chest: Effort normal.  Musculoskeletal: He exhibits tenderness. He exhibits no edema.  Patient has tenderness to the right arm  at the insertion of the triceps tendon at the elbow and along the triceps up to the shoulder. There is no swelling of the arm. No bony tenderness to the elbow or the shoulder. No pain in the forearm or wrist. He has normal sensation to light touch in the right arm. Normal motor function in the right arm. Radial pulses are intact. There is no pain along the cervical spine.  Neurological: He is alert and oriented to person, place, and time.  Skin: Skin is warm and dry.  Psychiatric: He has a normal mood and affect.     ED Treatments / Results  Labs (all labs ordered are listed, but only abnormal results are displayed) Labs Reviewed - No data to display  EKG  EKG Interpretation None       Radiology No results found.  Procedures Procedures (including critical care time)  Medications Ordered in ED Medications - No data to display   Initial Impression / Assessment and Plan / ED Course  I have reviewed the triage vital signs and the nursing notes.  Pertinent labs & imaging results that were available during my care of the patient were reviewed by me and considered in my medical decision making (see chart for details).  Clinical Course    I feel that the patient's symptoms are consistent with tendinitis. He was advised in icing and ibuprofen use. He denies any history of GERD or GI bleeding. 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 He will follow-up with his orthopedist if his symptoms are not improving. He was given a sling for comfort. Final Clinical Impressions(s) / ED Diagnoses   Final diagnoses:  Tendonitis    New Prescriptions New Prescriptions   No medications on file     Rolan Bucco, MD 07/11/16 1215

## 2017-11-01 ENCOUNTER — Emergency Department (HOSPITAL_BASED_OUTPATIENT_CLINIC_OR_DEPARTMENT_OTHER)
Admission: EM | Admit: 2017-11-01 | Discharge: 2017-11-01 | Disposition: A | Discharge status: Home / Self Care | Payer: Medicare HMO | Attending: Emergency Medicine | Admitting: Emergency Medicine

## 2017-11-01 ENCOUNTER — Emergency Department (HOSPITAL_BASED_OUTPATIENT_CLINIC_OR_DEPARTMENT_OTHER): Payer: Medicare HMO

## 2017-11-01 ENCOUNTER — Other Ambulatory Visit: Payer: Self-pay

## 2017-11-01 ENCOUNTER — Encounter (HOSPITAL_BASED_OUTPATIENT_CLINIC_OR_DEPARTMENT_OTHER): Payer: Self-pay | Admitting: *Deleted

## 2017-11-01 DIAGNOSIS — Y998 Other external cause status: Secondary | ICD-10-CM | POA: Diagnosis not present

## 2017-11-01 DIAGNOSIS — Z96641 Presence of right artificial hip joint: Secondary | ICD-10-CM | POA: Insufficient documentation

## 2017-11-01 DIAGNOSIS — S6992XA Unspecified injury of left wrist, hand and finger(s), initial encounter: Secondary | ICD-10-CM | POA: Diagnosis present

## 2017-11-01 DIAGNOSIS — Y929 Unspecified place or not applicable: Secondary | ICD-10-CM | POA: Diagnosis not present

## 2017-11-01 DIAGNOSIS — W231XXA Caught, crushed, jammed, or pinched between stationary objects, initial encounter: Secondary | ICD-10-CM | POA: Insufficient documentation

## 2017-11-01 DIAGNOSIS — Y939 Activity, unspecified: Secondary | ICD-10-CM | POA: Diagnosis not present

## 2017-11-01 DIAGNOSIS — Z79899 Other long term (current) drug therapy: Secondary | ICD-10-CM | POA: Diagnosis not present

## 2017-11-01 MED ORDER — CEPHALEXIN 500 MG PO CAPS: 500.0000 mg | ORAL_CAPSULE | Freq: Four times a day (QID) | ORAL | 0 refills | Status: AC

## 2017-11-01 NOTE — ED Provider Notes (Signed)
[facility] EMERGENCY DEPARTMENT Provider Note   CSN: [id number] Arrival date & time: 11/01/17  1951     History   Chief Complaint Chief Complaint  Patient presents with  . Finger Injury    HPI Terry Savage is a 71 y.o. Male, with an injury to the left fifth finger.  Patient reports he pinched the tip of the finger between 2 metal pulse this morning, will save the skin off of the finger pad.  Bleeding is controlled.  Patient cleaned the area with soap and water at home.  Tetanus is up-to-date.  Patient denies numbness or tingling, can move the finger without issue, no damage to the nailbed.  No injury to any other fingers, hand or wrist.        Past Medical History:  Diagnosis Date  . 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 Expected blood loss anemia 12/08/2012  . Overweight (BMI 25.0-29.9) 12/08/2012  . 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 JOINT REPLACEMENT     01/2010 RT HIP REPLACEMENT  . SURGERY FOR RT HIP DISLOCATIONS X 3     . TOTAL HIP REVISION  12/07/2012   Procedure: TOTAL HIP REVISION;  Surgeon: Shelda PalMatthew D Olin, MD;  Location: [facility];  Service: Orthopedics;  Laterality: Right;  acetabular revision       Home Medications    Prior to Admission medications   Medication Sig Start Date End Date Taking? Authorizing Provider  aspirin EC 325 MG tablet Take 1 tablet (325 mg total) by mouth 2 (two) times daily. X 4 weeks 12/08/12   Lanney GinsBabish, Matthew, PA-C  diphenhydrAMINE (BENADRYL) 25 mg capsule Take 1 capsule (25 mg total) by mouth every 6 (six) hours as needed for itching, allergies or sleep. 12/08/12   Lanney GinsBabish, Matthew, PA-C  docusate sodium 100 MG CAPS Take 100 mg by mouth 2 (two) times daily. 12/08/12   Lanney GinsBabish, Matthew, PA-C  ferrous sulfate 325 (65 FE) MG tablet Take 1 tablet (325 mg total) by mouth 3  (three) times daily after meals. 12/08/12   Lanney GinsBabish, Matthew, PA-C  methocarbamol (ROBAXIN) 500 MG tablet Take 1 tablet (500 mg total) by mouth every 6 (six) hours as needed. 12/08/12   Lanney GinsBabish, Matthew, PA-C  oxyCODONE (OXY IR/ROXICODONE) 5 MG immediate release tablet Take 1-3 tablets (5-15 mg total) by mouth every 4 (four) hours as needed for pain. 12/08/12   Lanney GinsBabish, Matthew, PA-C  polyethylene glycol (MIRALAX / GLYCOLAX) packet Take 17 g by mouth 2 (two) times daily. 12/08/12   Lanney GinsBabish, Matthew, PA-C    Family History No family history on file.  Social History Social History   Tobacco Use  . Smoking status: Never Smoker  . Smokeless tobacco: Never Used  Substance Use Topics  . Alcohol use: Yes    Comment: occasional  . Drug use: No     Allergies   Patient has no known allergies.   Review of Systems Review of Systems  Constitutional: Negative for chills and fever.  Musculoskeletal:       Injury to left fifth finger  Skin: Positive for wound.  Neurological: Negative for weakness and numbness.     Physical Exam Updated Vital Signs BP (!) 153/72   Pulse 79   Temp 98.2 F (36.8 C) (Oral)   Resp 20  Ht 6\' 1"  (1.854 m)   Wt 99.8 kg (220 lb)   SpO2 98%   BMI 29.03 kg/m   Physical Exam  Constitutional: He appears well-developed and well-nourished. No distress.  HENT:  Head: Normocephalic and atraumatic.  Eyes: Right eye exhibits no discharge. Left eye exhibits no discharge.  Pulmonary/Chest: Effort normal. No respiratory distress.  Musculoskeletal:  2 cm x 1 cm oval-shaped area on the finger pad of the left fifth finger where the skin has been avulsed, very does not appear to be deeper than the dermal layer, no swelling, no exposed bone. Normal range of motion of the finger at the DIP PIP and MCP with flexion and extension, sensation intact, good capillary refill 2+ radial pulse, all other fingers and remainder of left hand and normal bleeding is controlled,    Neurological: He is alert. Coordination normal.  Skin: Skin is warm and dry. Capillary refill takes less than 2 seconds. He is not diaphoretic.  Psychiatric: He has a normal mood and affect. His behavior is normal.  Nursing note and vitals reviewed.     ED Treatments / Results  Labs (all labs ordered are listed, but only abnormal results are displayed) Labs Reviewed - No data to display  EKG  EKG Interpretation None       Radiology Dg Finger Little Left  Result Date: 11/01/2017 CLINICAL DATA:  Laceration this morning, pinched finger between metal pieces,skin avulsion anterior surface mid distal phalanx, bled all day, controlled bleeding here EXAM: LEFT LITTLE FINGER 2+V COMPARISON:  None. FINDINGS: Mild diffuse soft tissue swelling of the left fifth finger. Bones appear intact. No evidence of acute fracture or dislocation. No focal bone lesion or bone destruction. No radiopaque soft tissue foreign bodies. IMPRESSION: Soft tissue swelling.  No acute bony abnormalities. Electronically Signed   By: Burman NievesWilliam  Stevens M.D.   On: 11/01/2017 22:17    Procedures Procedures (including critical care time)  Medications Ordered in ED Medications - No data to display   Initial Impression / Assessment and Plan / ED Course  I have reviewed the triage vital signs and the nursing notes.  Pertinent labs & imaging results that were available during my care of the patient were reviewed by me and considered in my medical decision making (see chart for details).  Patient presents with avulsion of the skin over the left fifth finger pad of the distal phalanx.  X-ray negative for fracture, injury does not extend deep dermal layer, no exposure of bone.  Hand is neurovascularly intact.  Hand surgery.  There is no tissue to repair at this point, finger dressed in Xeroform gauze.  Patient started on Keflex for infection prevention.  Patient to follow-up with patient stable for discharge home.  Signs of  infection that would warrant sooner evaluation discussed.  Patient discussed with Dr. Read DriversMolpus, who saw patient as well and agrees with plan.   Final Clinical Impressions(s) / ED Diagnoses   Final diagnoses:  Injury of finger of left hand, initial encounter    ED Discharge Orders        Ordered    cephALEXin (KEFLEX) 500 MG capsule  4 times daily     11/01/17 2242       Dartha LodgeFord, Neeraj Housand N, [place] JerseyPA-C 11/02/17 1310    Molpus, Jonny RuizJohn, MD 11/02/17 2249

## 2017-11-01 NOTE — ED Triage Notes (Signed)
Left 5th digit laceration on a metal pole this am. Skin avulsion to the distal finger. Bleeding controlled.

## 2017-11-01 NOTE — Discharge Instructions (Signed)
Keep area clean and dry, keep dressing over finger. Complete full course of antibiotics as directed to prevent infection. Please schedule appointment with Dr. Amanda PeaGramig with hand surgery for follow-up. If you have worsening surrounding redness, drainage from the area, swelling, fevers or chills please return to the ED for reevaluation.

## 2017-11-01 NOTE — ED Notes (Signed)
ED Provider at bedside. 

## 2017-11-02 MED FILL — CEPHALEXIN 500 MG CAPSULE: 500 | 7 days supply | Qty: 28 | Fill #0

## 2018-09-03 ENCOUNTER — Other Ambulatory Visit: Payer: Self-pay

## 2018-09-03 ENCOUNTER — Encounter (HOSPITAL_BASED_OUTPATIENT_CLINIC_OR_DEPARTMENT_OTHER): Payer: Self-pay | Admitting: Emergency Medicine

## 2018-09-03 ENCOUNTER — Emergency Department (HOSPITAL_BASED_OUTPATIENT_CLINIC_OR_DEPARTMENT_OTHER)
Admission: EM | Admit: 2018-09-03 | Discharge: 2018-09-03 | Disposition: A | Discharge status: Home / Self Care | Payer: Medicare HMO | Attending: Emergency Medicine | Admitting: Emergency Medicine

## 2018-09-03 DIAGNOSIS — Z7982 Long term (current) use of aspirin: Secondary | ICD-10-CM | POA: Insufficient documentation

## 2018-09-03 DIAGNOSIS — B029 Zoster without complications: Secondary | ICD-10-CM | POA: Diagnosis not present

## 2018-09-03 DIAGNOSIS — R21 Rash and other nonspecific skin eruption: Secondary | ICD-10-CM | POA: Diagnosis present

## 2018-09-03 DIAGNOSIS — Z96641 Presence of right artificial hip joint: Secondary | ICD-10-CM | POA: Diagnosis not present

## 2018-09-03 DIAGNOSIS — Z79899 Other long term (current) drug therapy: Secondary | ICD-10-CM | POA: Diagnosis not present

## 2018-09-03 MED ORDER — HYDROCODONE-ACETAMINOPHEN 5-325 MG PO TABS
1.0000 | ORAL_TABLET | Freq: Once | ORAL | Status: AC
  Administered 2018-09-03: 1 via ORAL
  Filled 2018-09-03: qty 1

## 2018-09-03 MED ORDER — PREDNISONE 20 MG PO TABS: 40.0000 mg | ORAL_TABLET | Freq: Every day | ORAL | 0 refills | Status: AC

## 2018-09-03 MED ORDER — VALACYCLOVIR HCL 1 G PO TABS: 1000.0000 mg | ORAL_TABLET | Freq: Three times a day (TID) | ORAL | 0 refills | Status: AC

## 2018-09-03 MED ORDER — HYDROCODONE-ACETAMINOPHEN 5-325 MG PO TABS: 1.0000 | ORAL_TABLET | Freq: Four times a day (QID) | ORAL | 0 refills | Status: AC | PRN

## 2018-09-03 NOTE — ED Triage Notes (Signed)
Sunburn to scalp on right side, since Wed and having pain to area. As well behind right ear, had a bx for possible skin Ca on Thursday

## 2018-09-03 NOTE — ED Provider Notes (Signed)
[facility] EMERGENCY DEPARTMENT Provider Note   CSN: [id number] Arrival date & time: 09/03/18  0750     History   Chief Complaint Chief Complaint  Patient presents with  . Sunburn    ear pain    HPI Terry Savage is a 72 y.o. male.  Patient is a healthy 72 year old male with a history of arthritis presenting today with complaints of extremely painful rash over the right side of his face that started a proximally 3 days ago.  Patient states he had a small area of skin cancer removed from a dermatologist 4 days ago but had been doing fine until he started to get severe pain in the area where the spot was removed but also radiating onto his face and into his head.  He then noticed 2 days ago a rash present on his right jaw area and the pain just continues.  He denies any fever or drainage from the area.  No hearing changes.  Otherwise no other complaints.  The history is provided by the patient.    Past Medical History:  Diagnosis Date  . 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 Expected blood loss anemia 12/08/2012  . Overweight (BMI 25.0-29.9) 12/08/2012  . 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 JOINT REPLACEMENT     01/2010 RT HIP REPLACEMENT  . SURGERY FOR RT HIP DISLOCATIONS X 3     . TOTAL HIP REVISION  12/07/2012   Procedure: TOTAL HIP REVISION;  Surgeon: Shelda PalMatthew D Olin, MD;  Location: [facility];  Service: Orthopedics;  Laterality: Right;  acetabular revision        Home Medications    Prior to Admission medications   Medication Sig Start Date End Date Taking? Authorizing Provider  aspirin EC 325 MG tablet Take 1 tablet (325 mg total) by mouth 2 (two) times daily. X 4 weeks 12/08/12   Lanney GinsBabish, Matthew, PA-C  cephALEXin (KEFLEX) 500 MG capsule Take 1 capsule (500 mg total) by mouth 4 (four) times daily. 11/01/17    Dartha LodgeFord, Kelsey N, PA-C  diphenhydrAMINE (BENADRYL) 25 mg capsule Take 1 capsule (25 mg total) by mouth every 6 (six) hours as needed for itching, allergies or sleep. 12/08/12   Lanney GinsBabish, Matthew, PA-C  docusate sodium 100 MG CAPS Take 100 mg by mouth 2 (two) times daily. 12/08/12   Lanney GinsBabish, Matthew, PA-C  ferrous sulfate 325 (65 FE) MG tablet Take 1 tablet (325 mg total) by mouth 3 (three) times daily after meals. 12/08/12   Lanney GinsBabish, Matthew, PA-C  HYDROcodone-acetaminophen (NORCO/VICODIN) 5-325 MG tablet Take 1-2 tablets by mouth every 6 (six) hours as needed. 09/03/18   Gwyneth SproutPlunkett, Cola Highfill, MD  imiquimod Mathis Dad(ALDARA) 5 % cream  08/31/18   [provider]  methocarbamol (ROBAXIN) 500 MG tablet Take 1 tablet (500 mg total) by mouth every 6 (six) hours as needed. 12/08/12   Lanney GinsBabish, Matthew, PA-C  oxyCODONE (OXY IR/ROXICODONE) 5 MG immediate release tablet Take 1-3 tablets (5-15 mg total) by mouth every 4 (four) hours as needed for pain. 12/08/12   Lanney GinsBabish, Matthew, PA-C  polyethylene glycol (MIRALAX / GLYCOLAX) packet Take 17 g by mouth 2 (two) times daily. 12/08/12   Lanney GinsBabish, Matthew, PA-C  predniSONE (DELTASONE) 20 MG tablet Take 2 tablets (40 mg total) by mouth daily. 09/03/18   Gwyneth SproutPlunkett, Chistina Roston,  MD  valACYclovir (VALTREX) 1000 MG tablet Take 1 tablet (1,000 mg total) by mouth 3 (three) times daily for 14 days. 09/03/18 09/17/18  Gwyneth Sprout, MD    Family History No family history on file.  Social History Social History   Tobacco Use  . Smoking status: Never Smoker  . Smokeless tobacco: Never Used  Substance Use Topics  . Alcohol use: Yes    Comment: occasional  . Drug use: No     Allergies   Patient has no known allergies.   Review of Systems Review of Systems  All other systems reviewed and are negative.    Physical Exam Updated Vital Signs BP (!) 151/86 (BP Location: Left Arm)   Pulse 72   Temp 98.7 F (37.1 C) (Oral)   Resp 16   Ht 6\' 1"  (1.854 m)   Wt 98.9 kg    SpO2 100%   BMI 28.76 kg/m   Physical Exam  Constitutional: He is oriented to person, place, and time. He appears well-developed and well-nourished. No distress.  HENT:  Head: Normocephalic and atraumatic.    Right Ear: Hearing, tympanic membrane and ear canal normal.  Eyes: Pupils are equal, round, and reactive to light.  Cardiovascular: Normal rate.  Pulmonary/Chest: Effort normal.  Neurological: He is alert and oriented to person, place, and time.  Skin: Skin is warm and dry. Capillary refill takes less than 2 seconds. Rash noted.  Vesicular, tender rash present in the C2 dermatomal region and small amt in V3  Psychiatric: He has a normal mood and affect. His behavior is normal.  Nursing note and vitals reviewed.    ED Treatments / Results  Labs (all labs ordered are listed, but only abnormal results are displayed) Labs Reviewed - No data to display  EKG None  Radiology No results found.  Procedures Procedures (including critical care time)  Medications Ordered in ED Medications  HYDROcodone-acetaminophen (NORCO/VICODIN) 5-325 MG per tablet 1 tablet (has no administration in time range)     Initial Impression / Assessment and Plan / ED Course  I have reviewed the triage vital signs and the nursing notes.  Pertinent labs & imaging results that were available during my care of the patient were reviewed by me and considered in my medical decision making (see chart for details).     72 year old male presenting today with vesicular rash consistent with shingles.  Patient was treated with Valtrex and prednisone.  He was also given pain control.  He has no history of diabetes or renal disease.  Last creatinine was in 2013 and less than 1.  Encouraged him to follow-up with his doctor in 1 week for recheck.  Final Clinical Impressions(s) / ED Diagnoses   Final diagnoses:  Herpes zoster without complication    ED Discharge Orders         Ordered    predniSONE  (DELTASONE) 20 MG tablet  Daily     09/03/18 0839    valACYclovir (VALTREX) 1000 MG tablet  3 times daily     09/03/18 0839    HYDROcodone-acetaminophen (NORCO/VICODIN) 5-325 MG tablet  Every 6 hours PRN     09/03/18 4010           Gwyneth Sprout, MD 09/03/18 0845

## 2018-11-01 ENCOUNTER — Other Ambulatory Visit (HOSPITAL_COMMUNITY): Payer: Self-pay | Admitting: Physician Assistant

## 2018-11-01 DIAGNOSIS — M25551 Pain in right hip: Secondary | ICD-10-CM

## 2018-11-23 ENCOUNTER — Encounter (HOSPITAL_COMMUNITY)
Admission: RE | Admit: 2018-11-23 | Discharge: 2018-11-23 | Disposition: A | Discharge status: Home / Self Care | Payer: Medicare HMO | Source: Ambulatory Visit | Attending: Physician Assistant | Admitting: Physician Assistant

## 2018-11-23 DIAGNOSIS — M25551 Pain in right hip: Secondary | ICD-10-CM | POA: Insufficient documentation

## 2018-11-23 MED ORDER — TECHNETIUM TC 99M MEDRONATE IV KIT
21.2000 | PACK | Freq: Once | INTRAVENOUS | Status: AC | PRN
  Administered 2018-11-23: 21.2 via INTRAVENOUS

## 2019-04-21 ENCOUNTER — Emergency Department (HOSPITAL_BASED_OUTPATIENT_CLINIC_OR_DEPARTMENT_OTHER)
Admission: EM | Admit: 2019-04-21 | Discharge: 2019-04-21 | Disposition: A | Discharge status: Home / Self Care | Payer: Medicare HMO | Attending: Emergency Medicine | Admitting: Emergency Medicine

## 2019-04-21 ENCOUNTER — Encounter (HOSPITAL_BASED_OUTPATIENT_CLINIC_OR_DEPARTMENT_OTHER): Payer: Self-pay | Admitting: Emergency Medicine

## 2019-04-21 ENCOUNTER — Other Ambulatory Visit: Payer: Self-pay

## 2019-04-21 ENCOUNTER — Emergency Department (HOSPITAL_BASED_OUTPATIENT_CLINIC_OR_DEPARTMENT_OTHER): Payer: Medicare HMO

## 2019-04-21 DIAGNOSIS — Y999 Unspecified external cause status: Secondary | ICD-10-CM | POA: Insufficient documentation

## 2019-04-21 DIAGNOSIS — Z79899 Other long term (current) drug therapy: Secondary | ICD-10-CM | POA: Diagnosis not present

## 2019-04-21 DIAGNOSIS — S300XXA Contusion of lower back and pelvis, initial encounter: Secondary | ICD-10-CM | POA: Diagnosis not present

## 2019-04-21 DIAGNOSIS — Y9389 Activity, other specified: Secondary | ICD-10-CM | POA: Diagnosis not present

## 2019-04-21 DIAGNOSIS — S0990XA Unspecified injury of head, initial encounter: Secondary | ICD-10-CM | POA: Insufficient documentation

## 2019-04-21 DIAGNOSIS — Y929 Unspecified place or not applicable: Secondary | ICD-10-CM | POA: Insufficient documentation

## 2019-04-21 DIAGNOSIS — Z96641 Presence of right artificial hip joint: Secondary | ICD-10-CM | POA: Insufficient documentation

## 2019-04-21 DIAGNOSIS — R42 Dizziness and giddiness: Secondary | ICD-10-CM | POA: Diagnosis not present

## 2019-04-21 DIAGNOSIS — I10 Essential (primary) hypertension: Secondary | ICD-10-CM | POA: Insufficient documentation

## 2019-04-21 DIAGNOSIS — W11XXXA Fall on and from ladder, initial encounter: Secondary | ICD-10-CM | POA: Diagnosis not present

## 2019-04-21 DIAGNOSIS — R55 Syncope and collapse: Secondary | ICD-10-CM | POA: Diagnosis present

## 2019-04-21 HISTORY — DX: Essential (primary) hypertension: I10

## 2019-04-21 LAB — CBC
HCT: 44 % (ref 39.0–52.0)
Hemoglobin: 15.2 g/dL (ref 13.0–17.0)
MCH: 31.5 pg (ref 26.0–34.0)
MCHC: 34.5 g/dL (ref 30.0–36.0)
MCV: 91.3 fL (ref 80.0–100.0)
Platelets: 244 10*3/uL (ref 150–400)
RBC: 4.82 MIL/uL (ref 4.22–5.81)
RDW: 12 % (ref 11.5–15.5)
WBC: 6.5 10*3/uL (ref 4.0–10.5)
nRBC: 0 % (ref 0.0–0.2)

## 2019-04-21 LAB — BASIC METABOLIC PANEL
Anion gap: 9 (ref 5–15)
BUN: 21 mg/dL (ref 8–23)
CO2: 23 mmol/L (ref 22–32)
Calcium: 9 mg/dL (ref 8.9–10.3)
Chloride: 108 mmol/L (ref 98–111)
Creatinine, Ser: 1.06 mg/dL (ref 0.61–1.24)
GFR calc Af Amer: >60 mL/min (ref >60)
GFR calc non Af Amer: >60 mL/min (ref >60)
Glucose, Bld: 99 mg/dL (ref 70–99)
Potassium: 4.2 mmol/L (ref 3.5–5.1)
Sodium: 140 mmol/L (ref 135–145)

## 2019-04-21 LAB — TROPONIN I: Troponin I: <0.03 ng/mL (ref <0.03)

## 2019-04-21 NOTE — ED Notes (Signed)
ED Provider at bedside. 

## 2019-04-21 NOTE — ED Triage Notes (Addendum)
Per his wife, pt was c/o dizziness earlier today. He climbed up a step ladder and she states he fell backward and lost consciousness for several seconds. Pt does not remember the event. Denies pain currently. His wife states he keeps asking her the same questions.

## 2019-04-21 NOTE — ED Notes (Signed)
Patient transported to CT 

## 2019-04-21 NOTE — ED Provider Notes (Signed)
[facility] EMERGENCY DEPARTMENT Provider Note   CSN: [id number] Arrival date & time: 04/21/19  1827    History   Chief Complaint Chief Complaint  Patient presents with   Loss of Consciousness    HPI Terry Savage is a 73 y.o. male.     Patient is a 73 year old male with history of hypertension.  He presents today for evaluation of syncope/fall.  Patient has been experiencing dizzy spells recently.  He describes these spells as "vertigo".  This sensation comes and goes where he feels off balance and as if the room is spinning.  Today he was on a stepladder installing a pool side shelter when he developed another episode of this dizziness and fell over.  He and the ladder tipped over and the patient landed on his lower back and back of his head.  He is complaining of headache and pain in his low back.  Patient has been exhibiting repetitive questioning with his wife and has no memory of the incident.     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 Authorizing Provider  lisinopril (ZESTRIL) 20 MG tablet Take 20 mg by mouth daily.   Yes [provider]  aspirin EC 325 MG tablet Take 1 tablet (325 mg total) by mouth 2 (two) times daily. X 4 weeks 12/08/12   Lanney Gins, PA-C  cephALEXin (KEFLEX) 500 MG capsule Take 1 capsule (500 mg total) by mouth 4 (four) times daily. 11/01/17   Dartha Lodge, PA-C  diphenhydrAMINE (BENADRYL) 25 mg capsule Take 1 capsule (25 mg total) by mouth every 6 (six) hours as needed for itching, allergies or sleep. 12/08/12   Lanney Gins, PA-C  docusate sodium 100 MG CAPS Take 100 mg by mouth 2 (two) times daily. 12/08/12   Lanney Gins, PA-C  ferrous sulfate 325 (65 FE) MG tablet Take 1 tablet (325 mg total) by mouth 3 (three) times daily after meals. 12/08/12   Lanney Gins, PA-C  HYDROcodone-acetaminophen (NORCO/VICODIN) 5-325 MG tablet Take 1-2 tablets by mouth every 6 (six) hours as needed. 09/03/18   Gwyneth Sprout, MD  imiquimod Mathis Dad) 5 % cream  08/31/18   [provider]  methocarbamol (ROBAXIN) 500 MG tablet Take 1 tablet (500 mg total) by mouth every 6 (six) hours as needed. 12/08/12   Lanney Gins, PA-C  oxyCODONE (OXY IR/ROXICODONE) 5 MG immediate release tablet Take 1-3 tablets (5-15 mg total) by mouth every 4 (four) hours as needed for pain. 12/08/12   Lanney Gins, PA-C  polyethylene glycol (MIRALAX / GLYCOLAX) packet Take 17 g by mouth 2 (two) times daily. 12/08/12   Lanney Gins, PA-C  predniSONE (DELTASONE) 20  MG tablet Take 2 tablets (40 mg total) by mouth daily. 09/03/18   Gwyneth Sprout, MD    Family History No family history on file.  Social History Social History   Tobacco Use   Smoking status: Never Smoker   Smokeless tobacco: Never Used  Substance Use Topics   Alcohol use: Yes    Comment: occasional   Drug use: No     Allergies   Patient has no known allergies.   Review of Systems Review of Systems  All other systems reviewed and are negative.    Physical Exam Updated Vital Signs BP (!) 144/89 (BP Location: Right Arm) Comment: Simultaneous filing. User may not have seen previous data.   Pulse 95    Temp 97.8 F (36.6 C) (Oral)    Resp 18    Ht  6\' 1"  (1.854 m)    Wt 94.3 kg    SpO2 100%    BMI 27.44 kg/m   Physical Exam Vitals signs and nursing note reviewed.  Constitutional:      General: He is not in acute distress.    Appearance: He is well-developed. He is not diaphoretic.  HENT:     Head: Normocephalic.     Comments: There are abrasions present to the occiput, however no lacerations or significant deformity.    Right Ear: Tympanic membrane normal.     Left Ear: Tympanic membrane normal.  Eyes:     Extraocular Movements: Extraocular movements intact.     Pupils: Pupils are equal, round, and reactive to light.  Neck:     Musculoskeletal: Normal range of motion and neck supple. No neck rigidity or muscular tenderness.  Cardiovascular:     Rate and Rhythm: Normal rate and regular rhythm.     Heart sounds: No murmur. No friction rub.  Pulmonary:     Effort: Pulmonary effort is normal. No respiratory distress.     Breath sounds: Normal breath sounds. No wheezing or rales.  Abdominal:     General: Bowel sounds are normal. There is no distension.     Palpations: Abdomen is soft.     Tenderness: There is no abdominal tenderness.  Musculoskeletal: Normal range of motion.     Comments: There is tenderness to palpation in the low lumbar/sacral region.  There is no step-off.  Skin:    General: Skin is warm and dry.  Neurological:     General: No focal deficit present.     Mental Status: He is alert and oriented to person, place, and time.     Cranial Nerves: No cranial nerve deficit.     Sensory: No sensory deficit.     Motor: No weakness.     Coordination: Coordination normal.      ED Treatments / Results  Labs (all labs ordered are listed, but only abnormal results are displayed) Labs Reviewed  BASIC METABOLIC PANEL  CBC  TROPONIN I    EKG EKG Interpretation  Date/Time:  Saturday Apr 21 2019 18:35:48 EDT Ventricular Rate:  86 PR Interval:    QRS Duration: 93 QT Interval:  366 QTC Calculation: 438 R  Axis:   71 Text Interpretation:  Sinus rhythm Atrial premature complex Confirmed by Geoffery Lyons ([id number]) on 04/21/2019 6:39:17 PM   Radiology No results found.  Procedures Procedures (including critical care time)  Medications Ordered in ED Medications - No data to display   Initial Impression / Assessment and Plan / ED Course  I have reviewed the triage vital signs  and the nursing notes.  Pertinent labs & imaging results that were available during my care of the patient were reviewed by me and considered in my medical decision making (see chart for details).  Patient presents after a fall from a ladder.  He has been having episodic dizziness for several weeks.  Today he was helping his wife assemble a shelter beside the pool when he became dizzy on the step ladder and he and the ladder tipped over and landed on the concrete.  He has abrasions to the back of his head along with headache and repetitive questioning.  He also has some amnesia to the events of the incident.  Patient appears to have a mild concussion as his head CT is unremarkable.  He is also complaining of pain in his low back.  CT scan of the lumbar spine and pelvis were both unremarkable.  Patient's EKG shows a normal sinus rhythm and laboratory studies are otherwise unremarkable.  These episodes of dizziness are most consistent with vertigo from what the patient describes to me.  At this point, I feel as though the patient is appropriate for discharge.  I will advise him to rest, take ibuprofen or Tylenol as needed for pain, and follow-up later this week with his primary doctor.  If these episodes of dizziness persist, he may require referral to ENT or possibly vestibular rehab.  Final Clinical Impressions(s) / ED Diagnoses   Final diagnoses:  None    ED Discharge Orders    None       Geoffery Lyonselo, Lin Hackmann, MD 04/21/19 2005

## 2019-04-21 NOTE — ED Notes (Signed)
Pt. returned from XR. 

## 2019-04-21 NOTE — Discharge Instructions (Signed)
Follow-up later this week with your primary doctor, and return to the ER in the meantime if your symptoms worsen or change.  Ibuprofen 600 mg every 6 hours as needed for pain.

## 2019-04-21 NOTE — ED Notes (Signed)
Wife states repetitive questions have improved, and pts memory of recent events seems to be improving. Pt still does not remember events surrounding his fall.

## 2020-01-03 ENCOUNTER — Ambulatory Visit: Payer: Medicare HMO | Attending: Internal Medicine

## 2020-01-03 DIAGNOSIS — Z23 Encounter for immunization: Secondary | ICD-10-CM | POA: Insufficient documentation

## 2020-01-03 NOTE — Progress Notes (Signed)
   Covid-19 Vaccination Clinic  Name:  Terry Savage    MRN: [id number] DOB: 05-07-1946  01/03/2020  [name] was observed post Covid-19 immunization for 15 minutes without incidence. He was provided with Vaccine Information Sheet and instruction to access the V-Safe system.   [name] was instructed to call 911 with any severe reactions post vaccine: Marland Kitchen Difficulty breathing  . Swelling of your face and throat  . A fast heartbeat  . A bad rash all over your body  . Dizziness and weakness    Immunizations Administered    Name Date Dose VIS Date Route   Pfizer COVID-19 Vaccine 01/03/2020  2:51 PM 0.3 mL 11/23/2019 Intramuscular   Manufacturer: [employer], [employer]   Lot: [id number]   NDC: [address]-5

## 2020-01-24 ENCOUNTER — Ambulatory Visit: Payer: Medicare HMO | Attending: Internal Medicine

## 2020-01-24 DIAGNOSIS — Z23 Encounter for immunization: Secondary | ICD-10-CM

## 2020-01-24 NOTE — Progress Notes (Signed)
   Covid-19 Vaccination Clinic  Name:  Terry Savage    MRN: [id number] DOB: 03-12-1946  01/24/2020  Mr. [name] was observed post Covid-19 immunization for 15 minutes without incidence. He was provided with Vaccine Information Sheet and instruction to access the V-Safe system.   Mr. [name] was instructed to call 911 with any severe reactions post vaccine: Marland Kitchen Difficulty breathing  . Swelling of your face and throat  . A fast heartbeat  . A bad rash all over your body  . Dizziness and weakness    Immunizations Administered    Name Date Dose VIS Date Route   Pfizer COVID-19 Vaccine 01/24/2020  2:33 PM 0.3 mL 11/23/2019 Intramuscular   Manufacturer: [employer], [employer]   Lot: [id number]   NDC: [address]-2

## 2020-06-10 ENCOUNTER — Telehealth: Payer: Self-pay

## 2020-06-10 NOTE — Telephone Encounter (Signed)
NOTES ON FILE FROM  [facility] [phone], SENT REFERRAL TO SCHEDULING
